# Patient Record
Sex: Female | Born: 1935 | Race: White | Hispanic: No | Marital: Married | Smoking: Former smoker
Health system: Southern US, Community
[De-identification: ages and names within clinical notes are randomized; demographics above are authoritative.]

## PROBLEM LIST (undated history)

## (undated) DIAGNOSIS — E7800 Pure hypercholesterolemia, unspecified: Secondary | ICD-10-CM

## (undated) DIAGNOSIS — D649 Anemia, unspecified: Secondary | ICD-10-CM

## (undated) DIAGNOSIS — F0390 Unspecified dementia without behavioral disturbance: Secondary | ICD-10-CM

## (undated) DIAGNOSIS — I10 Essential (primary) hypertension: Secondary | ICD-10-CM

## (undated) DIAGNOSIS — E079 Disorder of thyroid, unspecified: Secondary | ICD-10-CM

## (undated) DIAGNOSIS — K219 Gastro-esophageal reflux disease without esophagitis: Secondary | ICD-10-CM

## (undated) DIAGNOSIS — W19XXXA Unspecified fall, initial encounter: Secondary | ICD-10-CM

## (undated) DIAGNOSIS — E876 Hypokalemia: Secondary | ICD-10-CM

## (undated) DIAGNOSIS — R609 Edema, unspecified: Secondary | ICD-10-CM

## (undated) HISTORY — PX: SHOULDER SURGERY: SHX246

## (undated) HISTORY — PX: ABDOMINAL HYSTERECTOMY: SHX81

---

## 2003-12-14 ENCOUNTER — Ambulatory Visit: Payer: Self-pay | Admitting: Family Medicine

## 2004-06-19 ENCOUNTER — Ambulatory Visit: Payer: Self-pay | Admitting: Unknown Physician Specialty

## 2004-12-14 ENCOUNTER — Ambulatory Visit: Payer: Self-pay | Admitting: Family Medicine

## 2005-11-22 ENCOUNTER — Ambulatory Visit: Payer: Self-pay | Admitting: Family Medicine

## 2006-01-28 ENCOUNTER — Ambulatory Visit: Payer: Self-pay | Admitting: Family Medicine

## 2006-02-27 ENCOUNTER — Ambulatory Visit: Payer: Self-pay | Admitting: Family Medicine

## 2007-03-19 ENCOUNTER — Ambulatory Visit: Payer: Self-pay | Admitting: Family Medicine

## 2007-06-01 ENCOUNTER — Other Ambulatory Visit: Payer: Self-pay

## 2007-06-02 ENCOUNTER — Inpatient Hospital Stay: Payer: Self-pay | Admitting: Internal Medicine

## 2007-06-04 ENCOUNTER — Other Ambulatory Visit: Payer: Self-pay

## 2007-08-14 ENCOUNTER — Ambulatory Visit: Payer: Self-pay | Admitting: Family Medicine

## 2008-01-27 ENCOUNTER — Ambulatory Visit: Payer: Self-pay | Admitting: Cardiovascular Disease

## 2008-05-17 ENCOUNTER — Ambulatory Visit: Payer: Self-pay | Admitting: Family Medicine

## 2009-01-10 ENCOUNTER — Ambulatory Visit: Payer: Self-pay | Admitting: Family Medicine

## 2009-01-18 ENCOUNTER — Ambulatory Visit: Payer: Self-pay | Admitting: Family Medicine

## 2009-01-27 ENCOUNTER — Ambulatory Visit: Payer: Self-pay | Admitting: Family Medicine

## 2009-02-09 ENCOUNTER — Ambulatory Visit: Payer: Self-pay | Admitting: Family Medicine

## 2009-02-28 ENCOUNTER — Ambulatory Visit: Payer: Self-pay | Admitting: Family Medicine

## 2009-03-08 ENCOUNTER — Ambulatory Visit: Payer: Self-pay | Admitting: Unknown Physician Specialty

## 2009-05-09 ENCOUNTER — Ambulatory Visit: Payer: Self-pay | Admitting: Unknown Physician Specialty

## 2009-05-31 ENCOUNTER — Ambulatory Visit: Payer: Self-pay | Admitting: Family Medicine

## 2009-07-25 ENCOUNTER — Ambulatory Visit: Payer: Self-pay | Admitting: Family Medicine

## 2009-07-27 ENCOUNTER — Ambulatory Visit: Payer: Self-pay | Admitting: Family Medicine

## 2010-03-07 ENCOUNTER — Ambulatory Visit: Payer: Self-pay | Admitting: Family Medicine

## 2010-07-07 ENCOUNTER — Emergency Department: Payer: Self-pay | Admitting: Internal Medicine

## 2011-03-13 ENCOUNTER — Ambulatory Visit: Payer: Self-pay | Admitting: Family Medicine

## 2011-03-21 ENCOUNTER — Ambulatory Visit: Payer: Self-pay | Admitting: Unknown Physician Specialty

## 2011-10-30 ENCOUNTER — Ambulatory Visit: Payer: Self-pay | Admitting: Internal Medicine

## 2012-03-19 ENCOUNTER — Ambulatory Visit: Payer: Self-pay | Admitting: Internal Medicine

## 2012-09-09 ENCOUNTER — Ambulatory Visit: Payer: Self-pay

## 2013-01-11 ENCOUNTER — Emergency Department: Payer: Self-pay | Admitting: Emergency Medicine

## 2013-01-11 LAB — URINALYSIS, COMPLETE
Bilirubin,UR: NEGATIVE
Glucose,UR: >=500 mg/dL (ref 0–75)
Leukocyte Esterase: NEGATIVE
Ph: 5 (ref 4.5–8.0)
Protein: NEGATIVE
Squamous Epithelial: <1
WBC UR: 2 /HPF (ref 0–5)

## 2013-01-11 LAB — COMPREHENSIVE METABOLIC PANEL
Anion Gap: 8 (ref 7–16)
Bilirubin,Total: 0.2 mg/dL (ref 0.2–1.0)
Co2: 22 mmol/L (ref 21–32)
Creatinine: 1.19 mg/dL (ref 0.60–1.30)
EGFR (African American): 50 — ABNORMAL LOW
EGFR (Non-African Amer.): 43 — ABNORMAL LOW
Glucose: 226 mg/dL — ABNORMAL HIGH (ref 65–99)
Potassium: 3.3 mmol/L — ABNORMAL LOW (ref 3.5–5.1)
SGOT(AST): 26 U/L (ref 15–37)
SGPT (ALT): 22 U/L (ref 12–78)
Sodium: 141 mmol/L (ref 136–145)
Total Protein: 6.9 g/dL (ref 6.4–8.2)

## 2013-01-11 LAB — CBC
HCT: 33.9 % — ABNORMAL LOW (ref 35.0–47.0)
MCH: 29.2 pg (ref 26.0–34.0)
MCV: 87 fL (ref 80–100)
Platelet: 242 10*3/uL (ref 150–440)
RDW: 12.9 % (ref 11.5–14.5)
WBC: 12.9 10*3/uL — ABNORMAL HIGH (ref 3.6–11.0)

## 2013-01-18 ENCOUNTER — Emergency Department: Payer: Self-pay | Admitting: Emergency Medicine

## 2013-01-19 ENCOUNTER — Emergency Department: Payer: Self-pay | Admitting: Emergency Medicine

## 2013-02-26 ENCOUNTER — Ambulatory Visit: Payer: Self-pay | Admitting: Unknown Physician Specialty

## 2013-04-02 ENCOUNTER — Ambulatory Visit: Payer: Self-pay | Admitting: Internal Medicine

## 2013-10-23 ENCOUNTER — Emergency Department: Payer: Self-pay | Admitting: Emergency Medicine

## 2013-10-27 ENCOUNTER — Ambulatory Visit: Payer: Self-pay | Admitting: Unknown Physician Specialty

## 2014-04-11 ENCOUNTER — Emergency Department: Payer: Self-pay | Admitting: Emergency Medicine

## 2014-05-04 ENCOUNTER — Emergency Department: Payer: Self-pay | Admitting: Emergency Medicine

## 2014-05-04 LAB — URINALYSIS, COMPLETE
BILIRUBIN, UR: NEGATIVE
Glucose,UR: NEGATIVE mg/dL (ref 0–75)
KETONE: NEGATIVE
Nitrite: NEGATIVE
PH: 5 (ref 4.5–8.0)
Protein: 30
Specific Gravity: 1.03 (ref 1.003–1.030)

## 2014-05-04 LAB — CBC WITH DIFFERENTIAL/PLATELET
BASOS PCT: 0.6 %
Basophil #: 0 10*3/uL (ref 0.0–0.1)
Eosinophil #: 0.3 10*3/uL (ref 0.0–0.7)
Eosinophil %: 3.2 %
HCT: 36.2 % (ref 35.0–47.0)
HGB: 11.4 g/dL — ABNORMAL LOW (ref 12.0–16.0)
LYMPHS ABS: 1.3 10*3/uL (ref 1.0–3.6)
Lymphocyte %: 16.1 %
MCH: 24.2 pg — ABNORMAL LOW (ref 26.0–34.0)
MCHC: 31.5 g/dL — AB (ref 32.0–36.0)
MCV: 77 fL — ABNORMAL LOW (ref 80–100)
MONO ABS: 0.7 x10 3/mm (ref 0.2–0.9)
Monocyte %: 8.5 %
NEUTROS ABS: 5.8 10*3/uL (ref 1.4–6.5)
Neutrophil %: 71.6 %
Platelet: 273 10*3/uL (ref 150–440)
RBC: 4.71 10*6/uL (ref 3.80–5.20)
RDW: 15.8 % — ABNORMAL HIGH (ref 11.5–14.5)
WBC: 8.1 10*3/uL (ref 3.6–11.0)

## 2014-05-09 LAB — BASIC METABOLIC PANEL
Anion Gap: 8 (ref 7–16)
BUN: 11 mg/dL
Calcium, Total: 8.9 mg/dL
Chloride: 105 mmol/L
Co2: 25 mmol/L
Creatinine: 1.1 mg/dL — ABNORMAL HIGH
GFR CALC AF AMER: 55 — AB
GFR CALC NON AF AMER: 47 — AB
Glucose: 159 mg/dL — ABNORMAL HIGH
Potassium: 2.9 mmol/L — ABNORMAL LOW
Sodium: 138 mmol/L

## 2014-05-09 LAB — TROPONIN I: Troponin-I: <0.03 ng/mL

## 2014-10-15 ENCOUNTER — Emergency Department
Admission: EM | Admit: 2014-10-15 | Discharge: 2014-10-16 | Disposition: A | Discharge status: Home / Self Care | Payer: Medicare Other | Attending: Emergency Medicine | Admitting: Emergency Medicine

## 2014-10-15 ENCOUNTER — Encounter: Payer: Self-pay | Admitting: Emergency Medicine

## 2014-10-15 DIAGNOSIS — Y998 Other external cause status: Secondary | ICD-10-CM | POA: Diagnosis not present

## 2014-10-15 DIAGNOSIS — Z79899 Other long term (current) drug therapy: Secondary | ICD-10-CM | POA: Insufficient documentation

## 2014-10-15 DIAGNOSIS — Y9389 Activity, other specified: Secondary | ICD-10-CM | POA: Diagnosis not present

## 2014-10-15 DIAGNOSIS — S40012A Contusion of left shoulder, initial encounter: Secondary | ICD-10-CM | POA: Insufficient documentation

## 2014-10-15 DIAGNOSIS — S52501A Unspecified fracture of the lower end of right radius, initial encounter for closed fracture: Secondary | ICD-10-CM | POA: Insufficient documentation

## 2014-10-15 DIAGNOSIS — S7002XA Contusion of left hip, initial encounter: Secondary | ICD-10-CM | POA: Diagnosis not present

## 2014-10-15 DIAGNOSIS — Z791 Long term (current) use of non-steroidal anti-inflammatories (NSAID): Secondary | ICD-10-CM | POA: Insufficient documentation

## 2014-10-15 DIAGNOSIS — N390 Urinary tract infection, site not specified: Secondary | ICD-10-CM | POA: Insufficient documentation

## 2014-10-15 DIAGNOSIS — W19XXXA Unspecified fall, initial encounter: Secondary | ICD-10-CM

## 2014-10-15 DIAGNOSIS — Y9289 Other specified places as the place of occurrence of the external cause: Secondary | ICD-10-CM | POA: Diagnosis not present

## 2014-10-15 DIAGNOSIS — W1839XA Other fall on same level, initial encounter: Secondary | ICD-10-CM | POA: Diagnosis not present

## 2014-10-15 DIAGNOSIS — I10 Essential (primary) hypertension: Secondary | ICD-10-CM | POA: Diagnosis not present

## 2014-10-15 DIAGNOSIS — S6991XA Unspecified injury of right wrist, hand and finger(s), initial encounter: Secondary | ICD-10-CM | POA: Diagnosis present

## 2014-10-15 HISTORY — DX: Disorder of thyroid, unspecified: E07.9

## 2014-10-15 HISTORY — DX: Anemia, unspecified: D64.9

## 2014-10-15 HISTORY — DX: Hypokalemia: E87.6

## 2014-10-15 HISTORY — DX: Essential (primary) hypertension: I10

## 2014-10-15 HISTORY — DX: Pure hypercholesterolemia, unspecified: E78.00

## 2014-10-15 HISTORY — DX: Unspecified dementia, unspecified severity, without behavioral disturbance, psychotic disturbance, mood disturbance, and anxiety: F03.90

## 2014-10-15 LAB — BASIC METABOLIC PANEL
ANION GAP: 7 (ref 5–15)
BUN: 14 mg/dL (ref 6–20)
CHLORIDE: 111 mmol/L (ref 101–111)
CO2: 22 mmol/L (ref 22–32)
Calcium: 9.3 mg/dL (ref 8.9–10.3)
Creatinine, Ser: 0.8 mg/dL (ref 0.44–1.00)
GFR calc Af Amer: >60 mL/min (ref >60)
Glucose, Bld: 148 mg/dL — ABNORMAL HIGH (ref 65–99)
POTASSIUM: 3.5 mmol/L (ref 3.5–5.1)
SODIUM: 140 mmol/L (ref 135–145)

## 2014-10-15 LAB — CBC
HEMATOCRIT: 39.8 % (ref 35.0–47.0)
HEMOGLOBIN: 13.4 g/dL (ref 12.0–16.0)
MCH: 28.9 pg (ref 26.0–34.0)
MCHC: 33.6 g/dL (ref 32.0–36.0)
MCV: 85.8 fL (ref 80.0–100.0)
Platelets: 241 10*3/uL (ref 150–440)
RBC: 4.63 MIL/uL (ref 3.80–5.20)
RDW: 14.6 % — ABNORMAL HIGH (ref 11.5–14.5)
WBC: 10.3 10*3/uL (ref 3.6–11.0)

## 2014-10-15 NOTE — ED Notes (Addendum)
Pt's husband reports pt fell tonight on right side, denies hitting head or LOC. Pt reports right wrist pain. Pt unsure if slipped and fell.  Son reports pt fell on right side 2 weeks ago, reports hairline fx to right wrist. Pt actually fell on left side, c/o left hip pain. EMS said leg movement was good. Pt was confused at time of falling, thought there was another woman in the house when it was her husband.

## 2014-10-16 ENCOUNTER — Emergency Department: Payer: Medicare Other

## 2014-10-16 LAB — URINALYSIS COMPLETE WITH MICROSCOPIC (ARMC ONLY)
Bilirubin Urine: NEGATIVE
GLUCOSE, UA: NEGATIVE mg/dL
Ketones, ur: NEGATIVE mg/dL
NITRITE: NEGATIVE
Protein, ur: NEGATIVE mg/dL
SPECIFIC GRAVITY, URINE: 1.013 (ref 1.005–1.030)
pH: 5 (ref 5.0–8.0)

## 2014-10-16 MED ORDER — NITROFURANTOIN MONOHYD MACRO 100 MG PO CAPS
100.0000 mg | ORAL_CAPSULE | Freq: Once | ORAL | Status: AC
  Administered 2014-10-16: 100 mg via ORAL
  Filled 2014-10-16: qty 1

## 2014-10-16 MED ORDER — HYDROCODONE-ACETAMINOPHEN 5-325 MG PO TABS
1.0000 | ORAL_TABLET | Freq: Once | ORAL | Status: AC
  Administered 2014-10-16: 1 via ORAL
  Filled 2014-10-16: qty 1

## 2014-10-16 MED ORDER — HYDROCODONE-ACETAMINOPHEN 5-325 MG PO TABS: 1.0000 | ORAL_TABLET | Freq: Four times a day (QID) | ORAL | Status: DC | PRN

## 2014-10-16 MED ORDER — MORPHINE SULFATE (PF) 2 MG/ML IV SOLN
2.0000 mg | Freq: Once | INTRAVENOUS | Status: AC
  Administered 2014-10-16: 2 mg via INTRAVENOUS
  Filled 2014-10-16: qty 1

## 2014-10-16 MED ORDER — SODIUM CHLORIDE 0.9 % IV BOLUS (SEPSIS)
500.0000 mL | Freq: Once | INTRAVENOUS | Status: AC
  Administered 2014-10-16: 500 mL via INTRAVENOUS

## 2014-10-16 MED ORDER — NITROFURANTOIN MONOHYD MACRO 100 MG PO CAPS: 100.0000 mg | ORAL_CAPSULE | Freq: Two times a day (BID) | ORAL | Status: DC

## 2014-10-16 MED ORDER — ONDANSETRON HCL 4 MG/2ML IJ SOLN
4.0000 mg | Freq: Once | INTRAMUSCULAR | Status: AC
  Administered 2014-10-16: 4 mg via INTRAVENOUS
  Filled 2014-10-16: qty 2

## 2014-10-16 MED ORDER — IBUPROFEN 600 MG PO TABS: 600.0000 mg | ORAL_TABLET | Freq: Three times a day (TID) | ORAL | Status: DC | PRN

## 2014-10-16 NOTE — ED Provider Notes (Signed)
[facility] Emergency Department Provider Note  ____________________________________________  Time seen: Approximately 12:04 AM  I have reviewed the triage vital signs and the nursing notes.   HISTORY  Chief Complaint Fall and Wrist Pain  History obtain by patient, spouse, son and daughter-in-law.  HPI Alicia [name] is a 81 y.o. female who presents to the ED from home s/p fall. Patient is unsure what made her fall; husband states she was walking across the floor and fell onto her left side. Patient did not strike her head nor lose consciousness. Complains of left shoulder and left hip pain. Patient fell approximately 2 weeks ago and has a known hairline fracture to her right wrist. Right wrist appears more swollen since her fall this evening. EMS came out to the house; reports patient was somewhat confused. Patient declined EMS transport and came by POV. Denies recent fever, chills, chest pain, shortness of breath, abdominal pain, dysuria, vomiting, diarrhea, headache, neck pain.   Past Medical History  Diagnosis Date  . High cholesterol   . Low blood potassium   . Thyroid disease     hypothyroidism  . Anemia   . Hypertension   . Dementia     There are no active problems to display for this patient.   Past Surgical History  Procedure Laterality Date  . Abdominal hysterectomy    . Shoulder surgery Right     Current Outpatient Rx  Name  Route  Sig  Dispense  Refill  . donepezil (ARICEPT) 10 MG tablet   Oral   Take 1 tablet by mouth at bedtime.         . ferrous sulfate 325 (65 FE) MG EC tablet   Oral   Take 1 tablet by mouth daily.         Marland Kitchen levothyroxine (SYNTHROID, LEVOTHROID) 75 MCG tablet   Oral   Take 1 tablet by mouth every morning.         . meloxicam (MOBIC) 7.5 MG tablet   Oral   Take 1 tablet by mouth daily.         . memantine (NAMENDA) 10 MG tablet   Oral   Take 1 tablet by mouth daily.         . pantoprazole  (PROTONIX) 40 MG tablet   Oral   Take 1 tablet by mouth daily.         . potassium chloride SA (K-DUR,KLOR-CON) 20 MEQ tablet   Oral   Take 1 tablet by mouth daily.         . sertraline (ZOLOFT) 50 MG tablet   Oral   Take 1 tablet by mouth daily.         . simvastatin (ZOCOR) 20 MG tablet   Oral   Take 1 tablet by mouth at bedtime.         Marland Kitchen HYDROcodone-acetaminophen (NORCO) 5-325 MG per tablet   Oral   Take 1 tablet by mouth every 6 (six) hours as needed for moderate pain.   15 tablet   0   . ibuprofen (ADVIL,MOTRIN) 600 MG tablet   Oral   Take 1 tablet (600 mg total) by mouth every 8 (eight) hours as needed.   15 tablet   0   . nitrofurantoin, macrocrystal-monohydrate, (MACROBID) 100 MG capsule   Oral   Take 1 capsule (100 mg total) by mouth 2 (two) times daily.   10 capsule   0     Allergies Review of patient's  allergies indicates no known allergies.  No family history on file.  Social History Social History  Substance Use Topics  . Smoking status: Never Smoker   . Smokeless tobacco: None  . Alcohol Use: No    Review of Systems Constitutional: No fever/chills Eyes: No visual changes. ENT: No sore throat. Cardiovascular: Denies chest pain. Respiratory: Denies shortness of breath. Gastrointestinal: No abdominal pain.  No nausea, no vomiting.  No diarrhea.  No constipation. Genitourinary: Negative for dysuria. Musculoskeletal: Positive for right wrist, left shoulder and left hip pain. Negative for back pain. Skin: Negative for rash. Neurological: Negative for headaches, focal weakness or numbness.  10-point ROS otherwise negative.  ____________________________________________   PHYSICAL EXAM:  VITAL SIGNS: ED Triage Vitals  Enc Vitals Group     BP 10/15/14 2228 163/71 mmHg     Pulse Rate 10/15/14 2228 62     Resp 10/15/14 2335 14     Temp 10/15/14 2228 98.4 F (36.9 C)     Temp Source 10/15/14 2228 Oral     SpO2 10/15/14 2228 97 %      Weight 10/15/14 2228 140 lb (63.504 kg)     Height 10/15/14 2228 5' (1.524 m)     Head Cir --      Peak Flow --      Pain Score 10/15/14 2235 5     Pain Loc --      Pain Edu? --      Excl. in GC? --     Constitutional: Alert and oriented. Well appearing and in mild acute distress. Eyes: Conjunctivae are normal. PERRL. EOMI. Head: Atraumatic. Nose: No congestion/rhinnorhea. Mouth/Throat: Mucous membranes are moist.  Oropharynx non-erythematous. Neck: No stridor. No cervical spine tenderness to palpation. No step-offs or deformity. Cardiovascular: Normal rate, regular rhythm. Grossly normal heart sounds.  Good peripheral circulation. Respiratory: Normal respiratory effort.  No retractions. Lungs CTAB. Gastrointestinal: Soft and nontender. No distention. No abdominal bruits. No CVA tenderness. Genitourinary: Strong smell of urine. Musculoskeletal: Right wrist with swelling to dorsal aspect. Left shoulder with contusion but full range of movement. Pelvis stable. Left hip tender to palpation. Leg is not shortened nor rotated.  Neurologic: Alert and oriented to person and place. Normal speech and language. No gross focal neurologic deficits are appreciated.  Skin:  Skin is warm, dry and intact. No rash noted. Psychiatric: Mood and affect are normal. Speech and behavior are normal.  ____________________________________________   LABS (all labs ordered are listed, but only abnormal results are displayed)  Labs Reviewed  BASIC METABOLIC PANEL - Abnormal; Notable for the following:    Glucose, Bld 148 (*)    All other components within normal limits  CBC - Abnormal; Notable for the following:    RDW 14.6 (*)    All other components within normal limits  URINALYSIS COMPLETEWITH MICROSCOPIC ([facility] ONLY) - Abnormal; Notable for the following:    Color, Urine YELLOW (*)    APPearance HAZY (*)    Hgb urine dipstick 2+ (*)    Leukocytes, UA TRACE (*)    Bacteria, UA RARE (*)     Squamous Epithelial / LPF 0-5 (*)    All other components within normal limits  TROPONIN I   ____________________________________________  EKG  ED ECG REPORT I, Saysha Menta J, the attending physician, personally viewed and interpreted this ECG.   Date: 10/16/2014  EKG Time: 2243  Rate: 62  Rhythm: normal EKG, normal sinus rhythm  Axis: Normal  Intervals:none  ST&T Change: Nonspecific  ____________________________________________  RADIOLOGY  Right wrist x-ray (viewed by me, interpreted per Dr. Clovis Riley): Distal radius fracture with mild impaction.  Left shoulder x-rays (viewed by me, interpreted per Dr. Cherly Hensen): No evidence of fracture or dislocation.  Left hip x-ray (viewed by me, interpreted per Dr. Clovis Riley): Severe left hip arthritis.  CT head without contrast interpreted per Dr. Clovis Riley: Negative for acute intracranial traumatic injury. Unchanged small right frontal meningiomas. There is moderate generalized atrophy and chronic microvascular ischemic disease. ____________________________________________   PROCEDURES  Procedure(s) performed:   SPLINT APPLICATION Date/Time: 2:57 AM Authorized by: Irean Hong Consent: Verbal consent obtained. Risks and benefits: risks, benefits and alternatives were discussed Consent given by: patient Splint applied by: orthopedic technician Location details:  right wrist Splint type: thumb spica Supplies used: OCL Post-procedure: The splinted body part was neurovascularly unchanged following the procedure. Patient tolerance: Patient tolerated the procedure well with no immediate complications.    Critical Care performed: No  ____________________________________________   INITIAL IMPRESSION / ASSESSMENT AND PLAN / ED COURSE  Pertinent labs & imaging results that were available during my care of the patient were reviewed by me and considered in my medical decision making (see chart for details).  81 year old female  s/p fall with extremity pain and injury. Will obtain screening lab work and urinalysis; obtain CT head given patient's confusion per EMS report although patient is not confused here in the ED , plain film x-rays of right wrist, left shoulder and left hip. Will administer IV analgesia.  ----------------------------------------- 2:00 AM on 10/16/2014 -----------------------------------------  Updated patient and family of imaging results. Awaiting urine specimen. Will splint right wrist.  ----------------------------------------- 2:57 AM on 10/16/2014 -----------------------------------------  Patient ambulated to commode with minimal assistance. Has a walker at home. Plan for antibiotics for mild UTI, analgesics and orthopedics follow-up. Strict return precautions given. Patient and family verbalize understanding and agree with plan of care.  ____________________________________________   FINAL CLINICAL IMPRESSION(S) / ED DIAGNOSES  Final diagnoses:  Fall, initial encounter  Radius distal fracture, right, closed, initial encounter  UTI (lower urinary tract infection)  Shoulder contusion, left, initial encounter  Contusion, hip, left, initial encounter      Irean Hong, MD 10/16/14 [phone]

## 2014-10-16 NOTE — Discharge Instructions (Signed)
1. Take pain medicines as needed (Motrin/Norco #15). 2. Keep splint clean and dry.  3. Apply ice to affected area several times daily. 4. Take antibiotic as prescribed (Macrobid 100 mg twice daily 5 days). 5. Return to the ER for worsening symptoms, persistent vomiting, lethargy or other concerns.  Fall Prevention and Home Safety Falls cause injuries and can affect all age groups. It is possible to use preventive measures to significantly decrease the likelihood of falls. There are many simple measures which can make your home safer and prevent falls. OUTDOORS  Repair cracks and edges of walkways and driveways.  Remove high doorway thresholds.  Trim shrubbery on the main path into your home.  Have good outside lighting.  Clear walkways of tools, rocks, debris, and clutter.  Check that handrails are not broken and are securely fastened. Both sides of steps should have handrails.  Have leaves, snow, and ice cleared regularly.  Use sand or salt on walkways during winter months.  In the garage, clean up grease or oil spills. BATHROOM  Install night lights.  Install grab bars by the toilet and in the tub and shower.  Use non-skid mats or decals in the tub or shower.  Place a plastic non-slip stool in the shower to sit on, if needed.  Keep floors dry and clean up all water on the floor immediately.  Remove soap buildup in the tub or shower on a regular basis.  Secure bath mats with non-slip, double-sided rug tape.  Remove throw rugs and tripping hazards from the floors. BEDROOMS  Install night lights.  Make sure a bedside light is easy to reach.  Do not use oversized bedding.  Keep a telephone by your bedside.  Have a firm chair with side arms to use for getting dressed.  Remove throw rugs and tripping hazards from the floor. KITCHEN  Keep handles on pots and pans turned toward the center of the stove. Use back burners when possible.  Clean up spills quickly  and allow time for drying.  Avoid walking on wet floors.  Avoid hot utensils and knives.  Position shelves so they are not too high or low.  Place commonly used objects within easy reach.  If necessary, use a sturdy step stool with a grab bar when reaching.  Keep electrical cables out of the way.  Do not use floor polish or wax that makes floors slippery. If you must use wax, use non-skid floor wax.  Remove throw rugs and tripping hazards from the floor. STAIRWAYS  Never leave objects on stairs.  Place handrails on both sides of stairways and use them. Fix any loose handrails. Make sure handrails on both sides of the stairways are as long as the stairs.  Check carpeting to make sure it is firmly attached along stairs. Make repairs to worn or loose carpet promptly.  Avoid placing throw rugs at the top or bottom of stairways, or properly secure the rug with carpet tape to prevent slippage. Get rid of throw rugs, if possible.  Have an electrician put in a light switch at the top and bottom of the stairs. OTHER FALL PREVENTION TIPS  Wear low-heel or rubber-soled shoes that are supportive and fit well. Wear closed toe shoes.  When using a stepladder, make sure it is fully opened and both spreaders are firmly locked. Do not climb a closed stepladder.  Add color or contrast paint or tape to grab bars and handrails in your home. Place contrasting color strips on  first and last steps.  Learn and use mobility aids as needed. Install an electrical emergency response system.  Turn on lights to avoid dark areas. Replace light bulbs that burn out immediately. Get light switches that glow.  Arrange furniture to create clear pathways. Keep furniture in the same place.  Firmly attach carpet with non-skid or double-sided tape.  Eliminate uneven floor surfaces.  Select a carpet pattern that does not visually hide the edge of steps.  Be aware of all pets. OTHER HOME SAFETY TIPS  Set  the water temperature for 120 F (48.8 C).  Keep emergency numbers on or near the telephone.  Keep smoke detectors on every level of the home and near sleeping areas. Document Released: 01/18/2002 Document Revised: 07/30/2011 Document Reviewed: 04/19/2011 [facility] Patient Information 2015 [place], [place]. This information is not intended to replace advice given to you by your health care provider. Make sure you discuss any questions you have with your health care provider.  Urinary Tract Infection A urinary tract infection (UTI) can occur any place along the urinary tract. The tract includes the kidneys, ureters, bladder, and urethra. A type of germ called bacteria often causes a UTI. UTIs are often helped with antibiotic medicine.  HOME CARE   If given, take antibiotics as told by your doctor. Finish them even if you start to feel better.  Drink enough fluids to keep your pee (urine) clear or pale yellow.  Avoid tea, drinks with caffeine, and bubbly (carbonated) drinks.  Pee often. Avoid holding your pee in for a long time.  Pee before and after having sex (intercourse).  Wipe from front to back after you poop (bowel movement) if you are a woman. Use each tissue only once. GET HELP RIGHT AWAY IF:   You have back pain.  You have lower belly (abdominal) pain.  You have chills.  You feel sick to your stomach (nauseous).  You throw up (vomit).  Your burning or discomfort with peeing does not go away.  You have a fever.  Your symptoms are not better in 3 days. MAKE SURE YOU:   Understand these instructions.  Will watch your condition.  Will get help right away if you are not doing well or get worse. Document Released: 07/17/2007 Document Revised: 10/23/2011 Document Reviewed: 08/29/2011 [facility] Patient Information 2015 [place], [place]. This information is not intended to replace advice given to you by your health care provider. Make sure you discuss any questions you  have with your health care provider.  Splint Care Splints protect and rest injuries. Splints can be made of plaster, fiberglass, or metal. They are used to treat broken bones, sprains, tendonitis, and other injuries. HOME CARE  Keep the injured area raised (elevated) while sitting or lying down. Keep the injured body part just above the level of the heart. This will decrease puffiness (swelling) and pain.  If an elastic bandage was used to hold the splint, it can be loosened. Only loosen it to make room for puffiness and to ease pain.  Keep the splint clean and dry.  Do not scratch the skin under the splint with sharp or pointed objects.  Follow up with your doctor as told. GET HELP RIGHT AWAY IF:   There is more pain or pressure around the injury.  There is numbness, tingling, or pain in the toes or fingers past the injury.  The fingers or toes become cold or blue.  The splint becomes too soft or breaks before the injury is  healed. MAKE SURE YOU:   Understand these instructions.  Will watch this condition.  Will get help right away if you are not doing well or get worse. Document Released: 11/07/2007 Document Revised: 04/22/2011 Document Reviewed: 11/07/2007 [facility] Patient Information 2015 [place], [place]. This information is not intended to replace advice given to you by your health care provider. Make sure you discuss any questions you have with your health care provider.  Radial Fracture You have a broken bone (fracture) of the forearm. This is the part of your arm between the elbow and your wrist. Your forearm is made up of two bones. These are the radius and ulna. Your fracture is in the radial shaft. This is the bone in your forearm located on the thumb side. A cast or splint is used to protect and keep your injured bone from moving. The cast or splint will be on generally for about 5 to 6 weeks, with individual variations. HOME CARE INSTRUCTIONS   Keep the injured part  elevated while sitting or lying down. Keep the injury above the level of your heart (the center of the chest). This will decrease swelling and pain.  Apply ice to the injury for 15-20 minutes, 03-04 times per day while awake, for 2 days. Put the ice in a plastic bag and place a towel between the bag of ice and your cast or splint.  Move your fingers to avoid stiffness and minimize swelling.  If you have a plaster or fiberglass cast:  Do not try to scratch the skin under the cast using sharp or pointed objects.  Check the skin around the cast every day. You may put lotion on any red or sore areas.  Keep your cast dry and clean.  If you have a plaster splint:  Wear the splint as directed.  You may loosen the elastic around the splint if your fingers become numb, tingle, or turn cold or blue.  Do not put pressure on any part of your cast or splint. It may break. Rest your cast only on a pillow for the first 24 hours until it is fully hardened.  Your cast or splint can be protected during bathing with a plastic bag. Do not lower the cast or splint into water.  Only take over-the-counter or prescription medicines for pain, discomfort, or fever as directed by your caregiver. SEEK IMMEDIATE MEDICAL CARE IF:   Your cast gets damaged or breaks.  You have more severe pain or swelling than you did before getting the cast.  You have severe pain when stretching your fingers.  There is a bad smell, new stains and/or pus-like (purulent) drainage coming from under the cast.  Your fingers or hand turn pale or blue and become cold or your loose feeling. Document Released: 07/11/2005 Document Revised: 04/22/2011 Document Reviewed: 10/07/2005 [facility] Patient Information 2015 [place], [place]. This information is not intended to replace advice given to you by your health care provider. Make sure you discuss any questions you have with your health care provider.  Contusion A contusion is a deep  bruise. Contusions are the result of an injury that caused bleeding under the skin. The contusion may turn blue, purple, or yellow. Minor injuries will give you a painless contusion, but more severe contusions may stay painful and swollen for a few weeks.  CAUSES  A contusion is usually caused by a blow, trauma, or direct force to an area of the body. SYMPTOMS   Swelling and redness of the injured  area.  Bruising of the injured area.  Tenderness and soreness of the injured area.  Pain. DIAGNOSIS  The diagnosis can be made by taking a history and physical exam. An X-ray, CT scan, or MRI may be needed to determine if there were any associated injuries, such as fractures. TREATMENT  Specific treatment will depend on what area of the body was injured. In general, the best treatment for a contusion is resting, icing, elevating, and applying cold compresses to the injured area. Over-the-counter medicines may also be recommended for pain control. Ask your caregiver what the best treatment is for your contusion. HOME CARE INSTRUCTIONS   Put ice on the injured area.  Put ice in a plastic bag.  Place a towel between your skin and the bag.  Leave the ice on for 15-20 minutes, 3-4 times a day, or as directed by your health care provider.  Only take over-the-counter or prescription medicines for pain, discomfort, or fever as directed by your caregiver. Your caregiver may recommend avoiding anti-inflammatory medicines (aspirin, ibuprofen, and naproxen) for 48 hours because these medicines may increase bruising.  Rest the injured area.  If possible, elevate the injured area to reduce swelling. SEEK IMMEDIATE MEDICAL CARE IF:   You have increased bruising or swelling.  You have pain that is getting worse.  Your swelling or pain is not relieved with medicines. MAKE SURE YOU:   Understand these instructions.  Will watch your condition.  Will get help right away if you are not doing well or  get worse. Document Released: 11/07/2004 Document Revised: 02/02/2013 Document Reviewed: 12/03/2010 [facility] Patient Information 2015 [place], [place]. This information is not intended to replace advice given to you by your health care provider. Make sure you discuss any questions you have with your health care provider.  Iliac Crest Contusion An iliac crest contusion is a deep bruise of your hip bone (hip pointer). Contusions are the result of an injury that caused bleeding under the skin. The contusion may turn blue, purple, or yellow. Minor injuries will give you a painless contusion, but more severe contusions may stay painful and swollen for a few weeks.  CAUSES  An iliac crest contusion is usually caused by a blow to the top of your hip pointer. The injury most often comes from contact sports.  SYMPTOMS   Swelling and redness of the hip area.  Bruising of the hip area.  Tenderness or soreness of the hip. DIAGNOSIS  The diagnosis can be made by taking your history and doing a physical exam. You may need an X-ray of your hip pointer to look for a broken bone (fracture). TREATMENT  Often, the best treatment for an iliac crest contusion is resting, icing, elevating, and applying cold compresses to the injured area. Over-the-counter medicines may also be recommended for pain control. Crutches may be used if walking is very painful. Some people need physical therapy to help with range of motion and strengthening.  HOME CARE INSTRUCTIONS   Put ice on the injured area.  Put ice in a plastic bag.  Place a towel between your skin and the bag.  Leave the ice on for 15-20 minutes, 03-04 times a day.  Only take over-the-counter or prescription medicines for pain, discomfort, or fever as directed by your caregiver. Your caregiver may recommend avoiding anti-inflammatory medicines (aspirin, ibuprofen, and naproxen) for 48 hours because these medicines may increase bruising.  Keep your leg  straightened (extended) when possible.  Walk or move around as  the pain allows, or as directed by your caregiver. Use crutches if your caregiver recommends them.  Apply compression wraps as directed by your caregiver. You may remove the wraps for sleeping, showers, and baths. SEEK IMMEDIATE MEDICAL CARE IF:   You have increased bruising or swelling.  You have pain that is getting worse.  Your swelling or pain is not relieved with medicines.  Your toes get cold. MAKE SURE YOU:   Understand these instructions.  Will watch your condition.  Will get help right away if you are not doing well or get worse. Document Released: 10/23/2000 Document Revised: 04/22/2011 Document Reviewed: 12/14/2010 [facility] Patient Information 2015 [place], [place]. This information is not intended to replace advice given to you by your health care provider. Make sure you discuss any questions you have with your health care provider.

## 2014-11-30 ENCOUNTER — Other Ambulatory Visit: Payer: Self-pay | Admitting: Internal Medicine

## 2014-11-30 DIAGNOSIS — Z1231 Encounter for screening mammogram for malignant neoplasm of breast: Secondary | ICD-10-CM

## 2014-12-15 ENCOUNTER — Ambulatory Visit
Admission: RE | Admit: 2014-12-15 | Discharge: 2014-12-15 | Disposition: A | Discharge status: Home / Self Care | Payer: Medicare Other | Source: Ambulatory Visit | Attending: Internal Medicine | Admitting: Internal Medicine

## 2014-12-15 ENCOUNTER — Other Ambulatory Visit: Payer: Self-pay | Admitting: Internal Medicine

## 2014-12-15 DIAGNOSIS — Z1231 Encounter for screening mammogram for malignant neoplasm of breast: Secondary | ICD-10-CM

## 2015-04-02 ENCOUNTER — Emergency Department
Admission: EM | Admit: 2015-04-02 | Discharge: 2015-04-02 | Disposition: A | Discharge status: Home / Self Care | Payer: Medicare Other | Attending: Emergency Medicine | Admitting: Emergency Medicine

## 2015-04-02 ENCOUNTER — Encounter: Payer: Self-pay | Admitting: Emergency Medicine

## 2015-04-02 DIAGNOSIS — Z791 Long term (current) use of non-steroidal anti-inflammatories (NSAID): Secondary | ICD-10-CM | POA: Insufficient documentation

## 2015-04-02 DIAGNOSIS — Z79899 Other long term (current) drug therapy: Secondary | ICD-10-CM | POA: Diagnosis not present

## 2015-04-02 DIAGNOSIS — B351 Tinea unguium: Secondary | ICD-10-CM | POA: Insufficient documentation

## 2015-04-02 DIAGNOSIS — I10 Essential (primary) hypertension: Secondary | ICD-10-CM | POA: Insufficient documentation

## 2015-04-02 DIAGNOSIS — Z792 Long term (current) use of antibiotics: Secondary | ICD-10-CM | POA: Diagnosis not present

## 2015-04-02 DIAGNOSIS — M79675 Pain in left toe(s): Secondary | ICD-10-CM | POA: Diagnosis present

## 2015-04-02 NOTE — Discharge Instructions (Signed)
Nail Ringworm A fungal infection of the nail (tinea unguium/onychomycosis) is common. It is common as the visible part of the nail is composed of dead cells which have no blood supply to help prevent infection. It occurs because fungi are everywhere and will pick any opportunity to grow on any dead material. Because nails are very slow growing they require up to 2 years of treatment with anti-fungal medications. The entire nail back to the base is infected. This includes approximately  of the nail which you cannot see. If your caregiver has prescribed a medication by mouth, take it every day and as directed. No progress will be seen for at least 6 to 9 months. Do not be disappointed! Because fungi live on dead cells with little or no exposure to blood supply, medication delivery to the infection is slow; thus the cure is slow. It is also why you can observe no progress in the first 6 months. The nail becoming cured is the base of the nail, as it has the blood supply. Topical medication such as creams and ointments are usually not effective. Important in successful treatment of nail fungus is closely following the medication regimen that your doctor prescribes. Sometimes you and your caregiver may elect to speed up this process by surgical removal of all the nails. Even this may still require 6 to 9 months of additional oral medications. See your caregiver as directed. Remember there will be no visible improvement for at least 6 months. See your caregiver sooner if other signs of infection (redness and swelling) develop.   This information is not intended to replace advice given to you by your health care provider. Make sure you discuss any questions you have with your health care provider.   Document Released: 01/26/2000 Document Revised: 06/14/2014 Document Reviewed: 08/01/2014 Elsevier Interactive Patient Education 2016 [employer].   You have one area on her toenail that is worrisome and should be  looked at by a podiatrist. The remainder is a fungal infection were toenail gets thicker. Soak her foot in Epsom salt once or twice a day until he see the podiatrist.

## 2015-04-02 NOTE — ED Provider Notes (Signed)
[facility] Emergency Department Provider Note ____________________________________________  Time seen: Approximately 3:06 PM  I have reviewed the triage vital signs and the nursing notes.   HISTORY  Chief Complaint Toe Pain   HPI Alicia [name] is a 82 y.o. female is brought in today by her husband with complaints of left great toe pain for 2 days. When asked patient states that it is not hurting now. She had her husband both agree that she has not had an injury to her toe and there is been no swelling or redness noted. Patient states that the pain does not keep her up during the night and does not make it difficult for her to walk. Husband also agrees. Currently she denies pain.Patient's husband states that she has some dementia.   Past Medical History  Diagnosis Date  . High cholesterol   . Low blood potassium   . Thyroid disease     hypothyroidism  . Anemia   . Hypertension   . Dementia     There are no active problems to display for this patient.   Past Surgical History  Procedure Laterality Date  . Abdominal hysterectomy    . Shoulder surgery Right     Current Outpatient Rx  Name  Route  Sig  Dispense  Refill  . donepezil (ARICEPT) 10 MG tablet   Oral   Take 1 tablet by mouth at bedtime.         . ferrous sulfate 325 (65 FE) MG EC tablet   Oral   Take 1 tablet by mouth daily.         Marland Kitchen HYDROcodone-acetaminophen (NORCO) 5-325 MG per tablet   Oral   Take 1 tablet by mouth every 6 (six) hours as needed for moderate pain.   15 tablet   0   . ibuprofen (ADVIL,MOTRIN) 600 MG tablet   Oral   Take 1 tablet (600 mg total) by mouth every 8 (eight) hours as needed.   15 tablet   0   . levothyroxine (SYNTHROID, LEVOTHROID) 75 MCG tablet   Oral   Take 1 tablet by mouth every morning.         . meloxicam (MOBIC) 7.5 MG tablet   Oral   Take 1 tablet by mouth daily.         . memantine (NAMENDA) 10 MG tablet   Oral   Take 1  tablet by mouth daily.         . nitrofurantoin, macrocrystal-monohydrate, (MACROBID) 100 MG capsule   Oral   Take 1 capsule (100 mg total) by mouth 2 (two) times daily.   10 capsule   0   . pantoprazole (PROTONIX) 40 MG tablet   Oral   Take 1 tablet by mouth daily.         . potassium chloride SA (K-DUR,KLOR-CON) 20 MEQ tablet   Oral   Take 1 tablet by mouth daily.         . sertraline (ZOLOFT) 50 MG tablet   Oral   Take 1 tablet by mouth daily.         . simvastatin (ZOCOR) 20 MG tablet   Oral   Take 1 tablet by mouth at bedtime.           Allergies Review of patient's allergies indicates no known allergies.  Family History  Problem Relation Age of Onset  . Breast cancer Sister 62    Social History Social History  Substance Use Topics  .  Smoking status: Never Smoker   . Smokeless tobacco: None  . Alcohol Use: No    Review of Systems Constitutional: No fever/chills Cardiovascular: Denies chest pain. Respiratory: Denies shortness of breath. Gastrointestinal:  No nausea, no vomiting.  Musculoskeletal: Positive left great toe pain. Skin: Negative for rash. Neurological: Negative for headaches, focal weakness or numbness.  10-point ROS otherwise negative.  ____________________________________________   PHYSICAL EXAM:  VITAL SIGNS: ED Triage Vitals  Enc Vitals Group     BP 04/02/15 1316 130/73 mmHg     Pulse Rate 04/02/15 1316 63     Resp 04/02/15 1316 16     Temp 04/02/15 1316 98.2 F (36.8 C)     Temp Source 04/02/15 1316 Oral     SpO2 04/02/15 1316 98 %     Weight 04/02/15 1316 140 lb (63.504 kg)     Height 04/02/15 1316 5' (1.524 m)     Head Cir --      Peak Flow --      Pain Score --      Pain Loc --      Pain Edu? --      Excl. in GC? --     Constitutional: Alert and oriented. Well appearing and in no acute distress. Eyes: Conjunctivae are normal. PERRL. EOMI. Head: Atraumatic. Nose: No congestion/rhinnorhea. Neck: No  stridor.   Cardiovascular: Normal rate, regular rhythm. Grossly normal heart sounds.  Good peripheral circulation. Respiratory: Normal respiratory effort.  No retractions. Lungs CTAB. Musculoskeletal: Patient moves upper and lower extremities without any difficulty. Gait was not tested due to age. Neurologic:  Normal speech and language. No gross focal neurologic deficits are appreciated.  Skin:  Skin is warm, dry and intact. No rash noted. Left great toe there is some fungal infection present. Lateral aspect is affected more than medial. There is also a darkened almost brownish-black area lateral aspect included in the fungal infected area. Area is not tender. Skin is not red and appears to be nontender to palpation. Psychiatric: Mood and affect are normal. Speech and behavior are normal.  ____________________________________________   LABS (all labs ordered are listed, but only abnormal results are displayed)  Labs Reviewed - No data to display  PROCEDURES  Procedure(s) performed: None  Critical Care performed: No  ____________________________________________   INITIAL IMPRESSION / ASSESSMENT AND PLAN / ED COURSE  Pertinent labs & imaging results that were available during my care of the patient were reviewed by me and considered in my medical decision making (see chart for details).  Patient husband was instructed to have the podiatrist at [facility] check this area more so because of possible suspicion of a skin cancer. Patient was instructed to do Epsom salts soaks once a day to see if this gives her any relief. She is continue her regular medication as instructed. ____________________________________________   FINAL CLINICAL IMPRESSION(S) / ED DIAGNOSES  Final diagnoses:  Fungal infection of toenail      Tommi Rumps, PA-C 04/02/15 1605  Sharman Cheek, MD 04/05/15 1218

## 2015-04-02 NOTE — ED Notes (Signed)
Pt c/o left great toe pain for couple days. No other complaints. Toenail appears slightly discolored. No swelling or redness noted. No deformity

## 2015-07-24 ENCOUNTER — Observation Stay
Admission: EM | Admit: 2015-07-24 | Discharge: 2015-07-26 | Disposition: A | Discharge status: Skilled Nursing Facility | Payer: Medicare Other | Attending: Internal Medicine | Admitting: Internal Medicine

## 2015-07-24 ENCOUNTER — Emergency Department: Payer: Medicare Other

## 2015-07-24 ENCOUNTER — Encounter: Payer: Self-pay | Admitting: *Deleted

## 2015-07-24 DIAGNOSIS — E7800 Pure hypercholesterolemia, unspecified: Secondary | ICD-10-CM | POA: Insufficient documentation

## 2015-07-24 DIAGNOSIS — S2232XA Fracture of one rib, left side, initial encounter for closed fracture: Secondary | ICD-10-CM

## 2015-07-24 DIAGNOSIS — M25559 Pain in unspecified hip: Secondary | ICD-10-CM

## 2015-07-24 DIAGNOSIS — Z90710 Acquired absence of both cervix and uterus: Secondary | ICD-10-CM | POA: Insufficient documentation

## 2015-07-24 DIAGNOSIS — Z7982 Long term (current) use of aspirin: Secondary | ICD-10-CM | POA: Insufficient documentation

## 2015-07-24 DIAGNOSIS — F0281 Dementia in other diseases classified elsewhere with behavioral disturbance: Secondary | ICD-10-CM | POA: Insufficient documentation

## 2015-07-24 DIAGNOSIS — Z791 Long term (current) use of non-steroidal anti-inflammatories (NSAID): Secondary | ICD-10-CM | POA: Insufficient documentation

## 2015-07-24 DIAGNOSIS — Z9889 Other specified postprocedural states: Secondary | ICD-10-CM | POA: Diagnosis not present

## 2015-07-24 DIAGNOSIS — F0390 Unspecified dementia without behavioral disturbance: Secondary | ICD-10-CM

## 2015-07-24 DIAGNOSIS — F329 Major depressive disorder, single episode, unspecified: Secondary | ICD-10-CM | POA: Insufficient documentation

## 2015-07-24 DIAGNOSIS — S2242XA Multiple fractures of ribs, left side, initial encounter for closed fracture: Secondary | ICD-10-CM | POA: Diagnosis not present

## 2015-07-24 DIAGNOSIS — E039 Hypothyroidism, unspecified: Secondary | ICD-10-CM | POA: Insufficient documentation

## 2015-07-24 DIAGNOSIS — Z66 Do not resuscitate: Secondary | ICD-10-CM | POA: Insufficient documentation

## 2015-07-24 DIAGNOSIS — R079 Chest pain, unspecified: Secondary | ICD-10-CM | POA: Diagnosis present

## 2015-07-24 DIAGNOSIS — R52 Pain, unspecified: Secondary | ICD-10-CM | POA: Diagnosis present

## 2015-07-24 DIAGNOSIS — G309 Alzheimer's disease, unspecified: Secondary | ICD-10-CM | POA: Insufficient documentation

## 2015-07-24 DIAGNOSIS — R0781 Pleurodynia: Secondary | ICD-10-CM | POA: Insufficient documentation

## 2015-07-24 DIAGNOSIS — R262 Difficulty in walking, not elsewhere classified: Secondary | ICD-10-CM

## 2015-07-24 DIAGNOSIS — I10 Essential (primary) hypertension: Secondary | ICD-10-CM

## 2015-07-24 DIAGNOSIS — D649 Anemia, unspecified: Secondary | ICD-10-CM | POA: Diagnosis not present

## 2015-07-24 DIAGNOSIS — W19XXXA Unspecified fall, initial encounter: Secondary | ICD-10-CM | POA: Diagnosis not present

## 2015-07-24 DIAGNOSIS — Z79899 Other long term (current) drug therapy: Secondary | ICD-10-CM | POA: Insufficient documentation

## 2015-07-24 LAB — CBC WITH DIFFERENTIAL/PLATELET
BASOS PCT: 0 %
Basophils Absolute: 0 10*3/uL (ref 0–0.1)
EOS ABS: 0.4 10*3/uL (ref 0–0.7)
EOS PCT: 5 %
HCT: 38.3 % (ref 35.0–47.0)
Hemoglobin: 13.1 g/dL (ref 12.0–16.0)
LYMPHS ABS: 1.4 10*3/uL (ref 1.0–3.6)
Lymphocytes Relative: 17 %
MCH: 30.9 pg (ref 26.0–34.0)
MCHC: 34.2 g/dL (ref 32.0–36.0)
MCV: 90.4 fL (ref 80.0–100.0)
Monocytes Absolute: 0.7 10*3/uL (ref 0.2–0.9)
Monocytes Relative: 8 %
Neutro Abs: 5.7 10*3/uL (ref 1.4–6.5)
Neutrophils Relative %: 70 %
PLATELETS: 191 10*3/uL (ref 150–440)
RBC: 4.24 MIL/uL (ref 3.80–5.20)
RDW: 12.5 % (ref 11.5–14.5)
WBC: 8.1 10*3/uL (ref 3.6–11.0)

## 2015-07-24 LAB — BASIC METABOLIC PANEL
Anion gap: 7 (ref 5–15)
BUN: 22 mg/dL — AB (ref 6–20)
CO2: 26 mmol/L (ref 22–32)
CREATININE: 1.21 mg/dL — AB (ref 0.44–1.00)
Calcium: 9 mg/dL (ref 8.9–10.3)
Chloride: 107 mmol/L (ref 101–111)
GFR, EST AFRICAN AMERICAN: 47 mL/min — AB (ref >60)
GFR, EST NON AFRICAN AMERICAN: 40 mL/min — AB (ref >60)
Glucose, Bld: 104 mg/dL — ABNORMAL HIGH (ref 65–99)
POTASSIUM: 4.2 mmol/L (ref 3.5–5.1)
SODIUM: 140 mmol/L (ref 135–145)

## 2015-07-24 LAB — URINALYSIS COMPLETE WITH MICROSCOPIC (ARMC ONLY)
BACTERIA UA: NONE SEEN
Bilirubin Urine: NEGATIVE
GLUCOSE, UA: NEGATIVE mg/dL
Leukocytes, UA: NEGATIVE
Nitrite: NEGATIVE
Protein, ur: NEGATIVE mg/dL
Specific Gravity, Urine: 1.02 (ref 1.005–1.030)
pH: 5 (ref 5.0–8.0)

## 2015-07-24 LAB — TROPONIN I

## 2015-07-24 LAB — TSH: TSH: 1.045 u[IU]/mL (ref 0.350–4.500)

## 2015-07-24 MED ORDER — ACETAMINOPHEN 500 MG PO TABS
1000.0000 mg | ORAL_TABLET | Freq: Once | ORAL | Status: AC
  Administered 2015-07-24: 1000 mg via ORAL

## 2015-07-24 MED ORDER — ACETAMINOPHEN 500 MG PO TABS
ORAL_TABLET | ORAL | Status: AC
  Administered 2015-07-24: 1000 mg via ORAL
  Filled 2015-07-24: qty 2

## 2015-07-24 MED ORDER — DIVALPROEX SODIUM 125 MG PO CSDR
250.0000 mg | DELAYED_RELEASE_CAPSULE | Freq: Two times a day (BID) | ORAL | Status: DC
  Administered 2015-07-25 – 2015-07-26 (×4): 250 mg via ORAL
  Filled 2015-07-24 (×4): qty 2

## 2015-07-24 MED ORDER — FERROUS SULFATE 325 (65 FE) MG PO TABS
325.0000 mg | ORAL_TABLET | Freq: Every day | ORAL | Status: DC
  Administered 2015-07-25 – 2015-07-26 (×2): 325 mg via ORAL
  Filled 2015-07-24 (×2): qty 1

## 2015-07-24 MED ORDER — MEMANTINE HCL 10 MG PO TABS
10.0000 mg | ORAL_TABLET | Freq: Two times a day (BID) | ORAL | Status: DC
  Administered 2015-07-25 – 2015-07-26 (×4): 10 mg via ORAL
  Filled 2015-07-24 (×4): qty 1

## 2015-07-24 MED ORDER — NAPROXEN 500 MG PO TABS
ORAL_TABLET | ORAL | Status: AC
  Administered 2015-07-24: 250 mg via ORAL
  Filled 2015-07-24: qty 1

## 2015-07-24 MED ORDER — DONEPEZIL HCL 5 MG PO TABS
10.0000 mg | ORAL_TABLET | Freq: Every day | ORAL | Status: DC
  Administered 2015-07-25 (×2): 10 mg via ORAL
  Filled 2015-07-24 (×2): qty 2

## 2015-07-24 MED ORDER — LEVOTHYROXINE SODIUM 75 MCG PO TABS
75.0000 ug | ORAL_TABLET | Freq: Every day | ORAL | Status: DC
  Administered 2015-07-25 – 2015-07-26 (×2): 75 ug via ORAL
  Filled 2015-07-24 (×2): qty 1

## 2015-07-24 MED ORDER — SODIUM CHLORIDE 0.9% FLUSH
3.0000 mL | Freq: Two times a day (BID) | INTRAVENOUS | Status: DC
  Administered 2015-07-25 – 2015-07-26 (×4): 3 mL via INTRAVENOUS

## 2015-07-24 MED ORDER — PANTOPRAZOLE SODIUM 40 MG PO TBEC
40.0000 mg | DELAYED_RELEASE_TABLET | Freq: Every day | ORAL | Status: DC
  Administered 2015-07-25 – 2015-07-26 (×2): 40 mg via ORAL
  Filled 2015-07-24 (×2): qty 1

## 2015-07-24 MED ORDER — POTASSIUM CHLORIDE CRYS ER 20 MEQ PO TBCR
20.0000 meq | EXTENDED_RELEASE_TABLET | Freq: Every day | ORAL | Status: DC
  Administered 2015-07-25 – 2015-07-26 (×2): 20 meq via ORAL
  Filled 2015-07-24 (×2): qty 1

## 2015-07-24 MED ORDER — ONDANSETRON HCL 4 MG/2ML IJ SOLN: 4.0000 mg | Freq: Four times a day (QID) | INTRAMUSCULAR | Status: DC | PRN

## 2015-07-24 MED ORDER — ACETAMINOPHEN 325 MG PO TABS
650.0000 mg | ORAL_TABLET | Freq: Four times a day (QID) | ORAL | Status: DC | PRN
  Administered 2015-07-25: 650 mg via ORAL
  Filled 2015-07-24 (×2): qty 2

## 2015-07-24 MED ORDER — QUETIAPINE FUMARATE 25 MG PO TABS
25.0000 mg | ORAL_TABLET | Freq: Two times a day (BID) | ORAL | Status: DC
  Administered 2015-07-25 – 2015-07-26 (×4): 25 mg via ORAL
  Filled 2015-07-24 (×4): qty 1

## 2015-07-24 MED ORDER — SERTRALINE HCL 50 MG PO TABS
75.0000 mg | ORAL_TABLET | Freq: Every day | ORAL | Status: DC
  Administered 2015-07-25 – 2015-07-26 (×2): 75 mg via ORAL
  Filled 2015-07-24 (×2): qty 2

## 2015-07-24 MED ORDER — LORAZEPAM 0.5 MG PO TABS
0.5000 mg | ORAL_TABLET | Freq: Two times a day (BID) | ORAL | Status: DC | PRN
  Administered 2015-07-25 (×2): 0.5 mg via ORAL
  Filled 2015-07-24 (×2): qty 1

## 2015-07-24 MED ORDER — ENOXAPARIN SODIUM 30 MG/0.3ML ~~LOC~~ SOLN
30.0000 mg | SUBCUTANEOUS | Status: DC
  Administered 2015-07-25: 30 mg via SUBCUTANEOUS
  Filled 2015-07-24: qty 0.3

## 2015-07-24 MED ORDER — NAPROXEN 500 MG PO TABS
250.0000 mg | ORAL_TABLET | Freq: Once | ORAL | Status: AC
  Administered 2015-07-24: 250 mg via ORAL

## 2015-07-24 MED ORDER — SIMVASTATIN 20 MG PO TABS
20.0000 mg | ORAL_TABLET | Freq: Every day | ORAL | Status: DC
  Administered 2015-07-25 (×2): 20 mg via ORAL
  Filled 2015-07-24 (×2): qty 1

## 2015-07-24 MED ORDER — SENNOSIDES-DOCUSATE SODIUM 8.6-50 MG PO TABS: 1.0000 | ORAL_TABLET | Freq: Every evening | ORAL | Status: DC | PRN

## 2015-07-24 MED ORDER — ACETAMINOPHEN 650 MG RE SUPP: 650.0000 mg | Freq: Four times a day (QID) | RECTAL | Status: DC | PRN

## 2015-07-24 MED ORDER — ONDANSETRON HCL 4 MG PO TABS: 4.0000 mg | ORAL_TABLET | Freq: Four times a day (QID) | ORAL | Status: DC | PRN

## 2015-07-24 MED ORDER — ASPIRIN EC 81 MG PO TBEC
81.0000 mg | DELAYED_RELEASE_TABLET | Freq: Every day | ORAL | Status: DC
  Administered 2015-07-25 – 2015-07-26 (×2): 81 mg via ORAL
  Filled 2015-07-24 (×2): qty 1

## 2015-07-24 MED ORDER — HYDRALAZINE HCL 20 MG/ML IJ SOLN
10.0000 mg | Freq: Once | INTRAMUSCULAR | Status: AC
  Administered 2015-07-25: 10 mg via INTRAVENOUS
  Filled 2015-07-24: qty 1

## 2015-07-24 NOTE — ED Notes (Signed)
Pt resting in bed, family at bedside.

## 2015-07-24 NOTE — ED Notes (Signed)
Pt arrives [facility] with an unwitnessed fall, pt from memory care unit, skin tear to right arm, son at bedside

## 2015-07-24 NOTE — ED Notes (Signed)
Patient assisted into a recliner to help with her complaints of pain in the ribs and back.

## 2015-07-24 NOTE — ED Provider Notes (Signed)
[facility] Emergency Department Provider Note   ____________________________________________  Time seen:  Seen upon arrival to the emergency department  I have reviewed the triage vital signs and the nursing notes.   HISTORY  Chief Complaint Fall   HPI Alicia [name] is a 82 y.o. female with a history of dementia who had an unwitnessed fall in her memory care unit. This is her second fall over the past week. Her son is at the bedside and says that she has been slowly weakening over the past month. The patient denies any pain at this time, however en route she was pointing to her left chest as a source for pain. She does not recall the events of the fall and says that she fell while walking in her garden. This is not true as she was in her memory care unit. Her son says that at this time she is acting at her baseline with baseline mental status.   Past Medical History  Diagnosis Date  . High cholesterol   . Low blood potassium   . Thyroid disease     hypothyroidism  . Anemia   . Hypertension   . Dementia     There are no active problems to display for this patient.   Past Surgical History  Procedure Laterality Date  . Abdominal hysterectomy    . Shoulder surgery Right     Current Outpatient Rx  Name  Route  Sig  Dispense  Refill  . donepezil (ARICEPT) 10 MG tablet   Oral   Take 1 tablet by mouth at bedtime.         Marland Kitchen EXPIRED: ferrous sulfate 325 (65 FE) MG EC tablet   Oral   Take 1 tablet by mouth daily.         Marland Kitchen HYDROcodone-acetaminophen (NORCO) 5-325 MG per tablet   Oral   Take 1 tablet by mouth every 6 (six) hours as needed for moderate pain.   15 tablet   0   . ibuprofen (ADVIL,MOTRIN) 600 MG tablet   Oral   Take 1 tablet (600 mg total) by mouth every 8 (eight) hours as needed.   15 tablet   0   . levothyroxine (SYNTHROID, LEVOTHROID) 75 MCG tablet   Oral   Take 1 tablet by mouth every morning.         . meloxicam  (MOBIC) 7.5 MG tablet   Oral   Take 1 tablet by mouth daily.         . memantine (NAMENDA) 10 MG tablet   Oral   Take 1 tablet by mouth daily.         . nitrofurantoin, macrocrystal-monohydrate, (MACROBID) 100 MG capsule   Oral   Take 1 capsule (100 mg total) by mouth 2 (two) times daily.   10 capsule   0   . pantoprazole (PROTONIX) 40 MG tablet   Oral   Take 1 tablet by mouth daily.         . potassium chloride SA (K-DUR,KLOR-CON) 20 MEQ tablet   Oral   Take 1 tablet by mouth daily.         . sertraline (ZOLOFT) 50 MG tablet   Oral   Take 1 tablet by mouth daily.         . simvastatin (ZOCOR) 20 MG tablet   Oral   Take 1 tablet by mouth at bedtime.           Allergies Review of  patient's allergies indicates no known allergies.  Family History  Problem Relation Age of Onset  . Breast cancer Sister 37    Social History Social History  Substance Use Topics  . Smoking status: Never Smoker   . Smokeless tobacco: None  . Alcohol Use: No    Review of Systems Constitutional: No fever/chills Eyes: No visual changes. ENT: No sore throat. Cardiovascular: Left-sided chest pain Respiratory: Denies shortness of breath. Gastrointestinal: No abdominal pain.  No nausea, no vomiting.  No diarrhea.  No constipation. Genitourinary: Negative for dysuria. Musculoskeletal: Negative for back pain. Skin: Negative for rash. Neurological: Negative for headaches, focal weakness or numbness.  10-point ROS otherwise negative.  ____________________________________________   PHYSICAL EXAM:  VITAL SIGNS: ED Triage Vitals  Enc Vitals Group     BP 07/24/15 1614 154/65 mmHg     Pulse Rate 07/24/15 1614 59     Resp 07/24/15 1614 18     Temp 07/24/15 1614 97.8 F (36.6 C)     Temp Source 07/24/15 1614 Oral     SpO2 07/24/15 1614 97 %     Weight 07/24/15 1614 140 lb (63.504 kg)     Height 07/24/15 1614 5' (1.524 m)     Head Cir --      Peak Flow --      Pain  Score --      Pain Loc --      Pain Edu? --      Excl. in GC? --     Constitutional: Alert and oriented to self only. Well appearing and in no acute distress. Eyes: Conjunctivae are normal. PERRL. EOMI. Head: Atraumatic. Nose: No congestion/rhinnorhea. Mouth/Throat: Mucous membranes are moist.   Neck: No stridor.  No tenderness to palpation over the midline C-spine. No deformity or step-off. Cardiovascular: Normal rate, regular rhythm. Grossly normal heart sounds.   Respiratory: Normal respiratory effort.  No retractions. Lungs CTAB. Gastrointestinal: Soft and nontender. No distention. No CVA tenderness. Musculoskeletal: No lower extremity tenderness nor edema.  No joint effusions. Mild tenderness palpation over the proximal femur on the right. No deformity. 5 out of 5 strength bilateral lower extremities with active flexion at the hips bilaterally. Pelvis is stable without any tenderness to palpation. No tenderness to the thoracic or lumbar spines and there is no step-off or deformity. Neurologic:  Normal speech and language. No gross focal neurologic deficits are appreciated.  Skin:  Skin is warm, dry.  Superficial, 1 inch skin tear over the olecranon of the right elbow. The patient has full active range of motion of the right elbow. No active bleeding, no surrounding induration or pus. Psychiatric: Mood and affect are normal. Speech and behavior are normal.  ____________________________________________   LABS (all labs ordered are listed, but only abnormal results are displayed)  Labs Reviewed  CBC WITH DIFFERENTIAL/PLATELET  BASIC METABOLIC PANEL  TROPONIN I  TSH  URINALYSIS COMPLETEWITH MICROSCOPIC ([facility] ONLY)   ____________________________________________  EKG  ED ECG REPORT I, Arelia Longest, the attending physician, personally viewed and interpreted this ECG.   Date: 07/24/2015  EKG Time: 1720  Rate: 55  Rhythm: sinus bradycardia  Axis: Normal   Intervals:none  ST&T Change: T wave inversions in aVL as well as V2. No ST elevation or depression. T-wave inversion in aVL is seen on previous EKGs from 2016. The inversion in V2 is new but this also may be secondary to lead placement.   ____________________________________________  RADIOLOGY   DG HIP UNILAT WITH PELVIS  2-3 VIEWS RIGHT (Final result) Result time: 07/24/15 20:42:00   Final result by Rad Results In Interface (07/24/15 20:42:00)   Narrative:   CLINICAL DATA: Left hip pain after fall.  EXAM: DG HIP (WITH OR WITHOUT PELVIS) 2-3V RIGHT  COMPARISON: Left hip 07/24/2015  FINDINGS: Right hip is located without a fracture. Right pubic rami are intact.  IMPRESSION: No acute abnormality to the right hip.   Electronically Signed By: Richarda Overlie M.D. On: 07/24/2015 20:42          DG HIP UNILAT WITH PELVIS 2-3 VIEWS LEFT (Final result) Result time: 07/24/15 20:39:17   Final result by Rad Results In Interface (07/24/15 20:39:17)   Narrative:   CLINICAL DATA: Fall today with left hip pain, initial encounter  EXAM: DG HIP (WITH OR WITHOUT PELVIS) 2-3V LEFT  COMPARISON: 10/16/2014  FINDINGS: Degenerative changes of left hip joint are noted. No acute fracture or dislocation is seen. No gross soft tissue abnormality is noted.  IMPRESSION: No acute abnormality noted.   Electronically Signed By: Alcide Clever M.D. On: 07/24/2015 20:39          CT Head Wo Contrast (Final result) Result time: 07/24/15 17:07:58   Final result by Rad Results In Interface (07/24/15 17:07:58)   Narrative:   CLINICAL DATA: Recent fall  EXAM: CT HEAD WITHOUT CONTRAST  TECHNIQUE: Contiguous axial images were obtained from the base of the skull through the vertex without intravenous contrast.  COMPARISON: 10/16/2014  FINDINGS: Bony calvarium is intact. Degenerative changes of the left temporomandibular joint are seen and stable. Diffuse  atrophic changes stable from the prior exam. No findings to suggest acute hemorrhage, acute infarction or space-occupying mass lesion are seen. Calcified meningioma is again noted in the right frontoparietal region.  IMPRESSION: No acute abnormality noted. Stable chronic atrophic changes.   Electronically Signed By: Alcide Clever M.D. On: 07/24/2015 17:07          DG Ribs Unilateral W/Chest Left (Final result) Result time: 07/24/15 17:07:30   Final result by Rad Results In Interface (07/24/15 17:07:30)   Narrative:   CLINICAL DATA: Left upper/anterior chest wall pain status post unwitnessed fall.  EXAM: LEFT RIBS AND CHEST - 3+ VIEW  COMPARISON: Chest radiographs 10/23/2013. Thoracic spine radiographs 04/11/2014.  FINDINGS: The bones are mildly demineralized. There are several left lateral rib deformities involving the second through sixth ribs consistent with fractures. Some of these appear healed, while others could be subacute. No displaced rib fracture, pneumothorax or significant pleural effusion identified. The heart size and mediastinal contours are stable. The lungs are clear. Left upper quadrant abdominal calcification is unchanged, corresponding with a renal calcification on prior CT from 2014.  IMPRESSION: Several age indeterminate nondisplaced left-sided rib fractures are present. No evidence of pneumothorax or pleural effusion.   Electronically Signed By: Carey Bullocks M.D. On: 07/24/2015 17:07          DG Pelvis 1-2 Views (Final result) Result time: 07/24/15 17:11:29   Final result by Rad Results In Interface (07/24/15 17:11:29)   Narrative:   CLINICAL DATA: 82 year old female with unwitnessed fall. Pain. Initial encounter.  EXAM: PELVIS - 1-2 VIEW  COMPARISON: Right hip series 01/21/13  FINDINGS: Chronic asymmetric increased sclerosis and osteophytosis about the left hip appears stable. Both proximal femurs appear  grossly stable and intact. Pelvis intact. SI joints appear stable.  IMPRESSION: No acute fracture or dislocation identified about the pelvis. If there is lateralizing hip pain, recommend dedicated hip series.   Electronically Signed By:  Odessa FlemingH Hall M.D. On: 07/24/2015 17:11    ____________________________________________   PROCEDURES   ____________________________________________   INITIAL IMPRESSION / ASSESSMENT AND PLAN / ED COURSE  Pertinent labs & imaging results that were available during my care of the patient were reviewed by me and considered in my medical decision making (see chart for details).  I discussed how much workup to someone prefer he says that he would like a full work up including blood work and urine because of the patient's increased weakness over the past month with 2 falls in the past week. He says that she normally falls once about every 2 weeks.  ----------------------------------------- 9:00 PM on 07/24/2015 -----------------------------------------  Patient given Tylenol 9 Naprosyn for the left-sided rib pain. Says that it is still painful to take a deep breath especially over her left scapula. Patient also with difficulty ambulating with pain to her left hip but with bilaterally negative hip x-rays. We'll admit to the hospital for pain control, physical therapy consultation as well as pulmonary therapy. Signed out to Dr. Elisabeth PigeonVachhani. The patient's family is aware of this plan. Although the rib fractures were read as subacute the patient was not complaining of any rib pain before today. I have to think, because of the clinical aspects of this complaint that the rib fractures are likely new. Unclear cause of the fall but likely mechanical. Other than the radiologic findings the medical workup is largely reassuring for any further pathology. ____________________________________________   FINAL CLINICAL IMPRESSION(S) / ED DIAGNOSES  Left-sided rib  fractures. Fall. Ambulatory dysfunction. Hip pain.    NEW MEDICATIONS STARTED DURING THIS VISIT:  New Prescriptions   No medications on file     Note:  This document was prepared using Dragon voice recognition software and may include unintentional dictation errors.    Myrna Blazeravid Matthew Dameka Younker, MD 07/24/15 2101

## 2015-07-24 NOTE — H&P (Signed)
PCP:   Barbette ReichmannHANDE,VISHWANATH, MD   Chief Complaint:  Fall  HPI: This is a pleasant 82 year old female with severe dementia, residing at the [facility] unit [facility]. Today she fell and hit her head. The patient is a fall risk and did fall 3 days ago but today she hit her head. The protocol and the facilities to transfer patient out who hit their heads. In route the patient indicated that she had chest pains. The hospitalist was requested to admit. On discussing the patient's husband and son were present at bedside the patient does complain of chronic body aches. The patient is unable to give any further information due to her severe dementia.  Review of Systems:  Unable to obtain due to patient's severe dementia  Past Medical History: Past Medical History  Diagnosis Date  . High cholesterol   . Low blood potassium   . Thyroid disease     hypothyroidism  . Anemia   . Hypertension   . Dementia    Past Surgical History  Procedure Laterality Date  . Abdominal hysterectomy    . Shoulder surgery Right     Medications: Prior to Admission medications   Medication Sig Start Date End Date Taking? Authorizing Provider  aspirin EC 81 MG tablet Take 81 mg by mouth daily.   Yes Historical Provider, MD  cyanocobalamin 500 MCG tablet Take 500 mcg by mouth daily.   Yes Historical Provider, MD  divalproex (DEPAKOTE SPRINKLE) 125 MG capsule Take 250 mg by mouth 2 (two) times daily.   Yes Historical Provider, MD  donepezil (ARICEPT) 10 MG tablet Take 10 mg by mouth at bedtime.    Yes Historical Provider, MD  ferrous sulfate 325 (65 FE) MG EC tablet Take 325 mg by mouth daily with breakfast.    Yes Historical Provider, MD  levothyroxine (SYNTHROID, LEVOTHROID) 75 MCG tablet Take 75 mcg by mouth daily before breakfast.    Yes Historical Provider, MD  LORazepam (ATIVAN) 0.5 MG tablet Take 0.5 mg by mouth 2 (two) times daily as needed for anxiety (and/or agitation).   Yes Historical Provider, MD   meloxicam (MOBIC) 7.5 MG tablet Take 7.5 mg by mouth daily.    Yes Historical Provider, MD  memantine (NAMENDA) 10 MG tablet Take 10 mg by mouth 2 (two) times daily.    Yes Historical Provider, MD  pantoprazole (PROTONIX) 40 MG tablet Take 40 mg by mouth daily before breakfast.    Yes Historical Provider, MD  potassium chloride SA (K-DUR,KLOR-CON) 20 MEQ tablet Take 20 mEq by mouth daily.    Yes Historical Provider, MD  QUEtiapine (SEROQUEL) 25 MG tablet Take 25 mg by mouth 2 (two) times daily.   Yes Historical Provider, MD  sertraline (ZOLOFT) 50 MG tablet Take 75 mg by mouth daily.    Yes Historical Provider, MD  simvastatin (ZOCOR) 20 MG tablet Take 20 mg by mouth at bedtime.    Yes Historical Provider, MD    Allergies:  No Known Allergies  Social History:  reports that she has never smoked. She does not have any smokeless tobacco history on file. She reports that she does not drink alcohol. Her drug history is not on file.  Family History: Family History  Problem Relation Age of Onset  . Breast cancer Sister 6360    Physical Exam: Filed Vitals:   07/24/15 1614 07/24/15 1805 07/24/15 2013  BP: 154/65 176/69 165/71  Pulse: 59  65  Temp: 97.8 F (36.6 C)  98.1  F (36.7 C)  TempSrc: Oral  Oral  Resp: Height: 5' (1.524 m)    Weight: 63.504 kg (140 lb)    SpO2: 97% 98% 99%    General: pleasantly demented , well developed and nourished, no acute distress Eyes: PERRLA, pink conjunctiva, no scleral icterus ENT: Moist oral mucosa, neck supple, no thyromegaly Lungs: clear to ascultation, no wheeze, no crackles, no use of accessory muscles Cardiovascular: regular rate and rhythm, no regurgitation, no gallops, no murmurs. No carotid bruits, no JVD, reproducible chest wall pain left sided Abdomen: soft, positive BS, non-tender, non-distended, no organomegaly, not an acute abdomen GU: not examined Neuro: Appears CN II - XII grossly intact, Musculoskeletal: strength 5/5 all  extremities, no clubbing, cyanosis or edema Skin: no rash, no subcutaneous crepitation, no decubitus Psych: pleasantly demented   Labs on Admission:   Recent Labs  07/24/15 1722  NA 140  K 4.2  CL 107  CO2 26  GLUCOSE 104*  BUN 22*  CREATININE 1.21*  CALCIUM 9.0   No results for input(s): AST, ALT, ALKPHOS, BILITOT, PROT, ALBUMIN in the last 72 hours. No results for input(s): LIPASE, AMYLASE in the last 72 hours.  Recent Labs  07/24/15 1722  WBC 8.1  NEUTROABS 5.7  HGB 13.1  HCT 38.3  MCV 90.4  PLT 191    Recent Labs  07/24/15 1722  TROPONINI <0.03   Invalid input(s): POCBNP No results for input(s): DDIMER in the last 72 hours. No results for input(s): HGBA1C in the last 72 hours. No results for input(s): CHOL, HDL, LDLCALC, TRIG, CHOLHDL, LDLDIRECT in the last 72 hours.  Recent Labs  07/24/15 1722  TSH 1.045   No results for input(s): VITAMINB12, FOLATE, FERRITIN, TIBC, IRON, RETICCTPCT in the last 72 hours.  Micro Results: No results found for this or any previous visit (from the past 240 hour(s)).   Radiological Exams on Admission: Dg Ribs Unilateral W/chest Left  07/24/2015  CLINICAL DATA:  Left upper/anterior chest wall pain status post unwitnessed fall. EXAM: LEFT RIBS AND CHEST - 3+ VIEW COMPARISON:  Chest radiographs 10/23/2013. Thoracic spine radiographs 04/11/2014. FINDINGS: The bones are mildly demineralized. There are several left lateral rib deformities involving the second through sixth ribs consistent with fractures. Some of these appear healed, while others could be subacute. No displaced rib fracture, pneumothorax or significant pleural effusion identified. The heart size and mediastinal contours are stable. The lungs are clear. Left upper quadrant abdominal calcification is unchanged, corresponding with a renal calcification on prior CT from 2014. IMPRESSION: Several age indeterminate nondisplaced left-sided rib fractures are present. No  evidence of pneumothorax or pleural effusion. Electronically Signed   By: Carey Bullocks M.D.   On: 07/24/2015 17:07   Dg Pelvis 1-2 Views  07/24/2015  CLINICAL DATA:  82 year old female with unwitnessed fall. Pain. Initial encounter. EXAM: PELVIS - 1-2 VIEW COMPARISON:  Right hip series 01/21/13 FINDINGS: Chronic asymmetric increased sclerosis and osteophytosis about the left hip appears stable. Both proximal femurs appear grossly stable and intact. Pelvis intact. SI joints appear stable. IMPRESSION: No acute fracture or dislocation identified about the pelvis. If there is lateralizing hip pain, recommend dedicated hip series. Electronically Signed   By: Odessa Fleming M.D.   On: 07/24/2015 17:11   Ct Head Wo Contrast  07/24/2015  CLINICAL DATA:  Recent fall EXAM: CT HEAD WITHOUT CONTRAST TECHNIQUE: Contiguous axial images were obtained from the base of the skull through the vertex without intravenous contrast. COMPARISON:  10/16/2014 FINDINGS: Bony calvarium is intact. Degenerative changes of the left temporomandibular joint are seen and stable. Diffuse atrophic changes stable from the prior exam. No findings to suggest acute hemorrhage, acute infarction or space-occupying mass lesion are seen. Calcified meningioma is again noted in the right frontoparietal region. IMPRESSION: No acute abnormality noted.  Stable chronic atrophic changes. Electronically Signed   By: Alcide Clever M.D.   On: 07/24/2015 17:07   Dg Hip Unilat With Pelvis 2-3 Views Left  07/24/2015  CLINICAL DATA:  Fall today with left hip pain, initial encounter EXAM: DG HIP (WITH OR WITHOUT PELVIS) 2-3V LEFT COMPARISON:  10/16/2014 FINDINGS: Degenerative changes of left hip joint are noted. No acute fracture or dislocation is seen. No gross soft tissue abnormality is noted. IMPRESSION: No acute abnormality noted. Electronically Signed   By: Alcide Clever M.D.   On: 07/24/2015 20:39   Dg Hip Unilat With Pelvis 2-3 Views Right  07/24/2015  CLINICAL  DATA:  Left hip pain after fall. EXAM: DG HIP (WITH OR WITHOUT PELVIS) 2-3V RIGHT COMPARISON:  Left hip 07/24/2015 FINDINGS: Right hip is located without a fracture. Right pubic rami are intact. IMPRESSION: No acute abnormality to the right hip. Electronically Signed   By: Richarda Overlie M.D.   On: 07/24/2015 20:42    Assessment/Plan Present on Admission:  . Chest pain/indeterminate age refraction left-sided -Likely due to rib fractures. -Will monitor patient telemetry overnight   Fall -Fall risk  Dyslipidemia -Stable, home medications resumed.  Hypothyroidism -Stable, home medications resumed  HTN -Uncontrolled, home medications resumed. When necessary blood pressure medications ordered  Severe Dementia -Stable, home medications resumed   Alicia [name] 07/24/2015, 9:45 PM

## 2015-07-24 NOTE — ED Notes (Signed)
Patient transported to X-ray 

## 2015-07-25 ENCOUNTER — Observation Stay
Admit: 2015-07-25 | Discharge: 2015-07-25 | Disposition: A | Discharge status: Home / Self Care | Payer: Medicare Other | Attending: Internal Medicine | Admitting: Internal Medicine

## 2015-07-25 DIAGNOSIS — S2242XA Multiple fractures of ribs, left side, initial encounter for closed fracture: Secondary | ICD-10-CM | POA: Diagnosis not present

## 2015-07-25 LAB — BASIC METABOLIC PANEL
Anion gap: 12 (ref 5–15)
BUN: 18 mg/dL (ref 6–20)
CHLORIDE: 104 mmol/L (ref 101–111)
CO2: 22 mmol/L (ref 22–32)
Calcium: 9.2 mg/dL (ref 8.9–10.3)
Creatinine, Ser: 0.78 mg/dL (ref 0.44–1.00)
GFR calc Af Amer: >60 mL/min (ref >60)
GFR calc non Af Amer: >60 mL/min (ref >60)
GLUCOSE: 123 mg/dL — AB (ref 65–99)
POTASSIUM: 3.9 mmol/L (ref 3.5–5.1)
Sodium: 138 mmol/L (ref 135–145)

## 2015-07-25 LAB — ECHOCARDIOGRAM COMPLETE
AV Area mean vel: 1.66 cm2
AV Mean grad: 11 mmHg
AV VEL mean LVOT/AV: 0.59
AV area mean vel ind: 1.18 cm2/m2
AVA: 1.67 cm2
AVAREAVTI: 1.56 cm2
AVAREAVTIIND: 1.18 cm2/m2
AVPG: 17 mmHg
AVPKVEL: 208 cm/s
Ao pk vel: 0.55 m/s
CHL CUP AV PEAK INDEX: 1.11
CHL CUP AV VEL: 1.67
DOP CAL AO MEAN VELOCITY: 157 cm/s
E/e' ratio: 7.92
EWDT: 292 ms
FS: 22 % — AB (ref 28–44)
Height: 60 in
IV/PV OW: 1.31
LA ID, A-P, ES: 37 mm
LA diam index: 2.62 cm/m2
LA vol A4C: 21.9 ml
LA vol index: 22.1 mL/m2
LA vol: 31.1 mL
LEFT ATRIUM END SYS DIAM: 37 mm
LV TDI E'MEDIAL: 6.31
LV e' LATERAL: 8.33 cm/s
LVEEAVG: 7.92
LVEEMED: 7.92
LVOT SV: 76 mL
LVOT VTI: 26.8 cm
LVOT area: 2.84 cm2
LVOT peak VTI: 0.59 cm
LVOT peak grad rest: 5 mmHg
LVOT peak vel: 114 cm/s
LVOTD: 19 mm
MV Dec: 292
MV pk A vel: 104 m/s
MVPKEVEL: 66 m/s
PW: 8.4 mm — AB (ref 0.6–1.1)
RV TAPSE: 21.4 mm
TDI e' lateral: 8.33
VTI: 45.7 cm
Valve area index: 1.18
WEIGHTICAEL: 1662.4 [oz_av]

## 2015-07-25 LAB — CBC
HEMATOCRIT: 42.9 % (ref 35.0–47.0)
HEMOGLOBIN: 14.3 g/dL (ref 12.0–16.0)
MCH: 30.6 pg (ref 26.0–34.0)
MCHC: 33.4 g/dL (ref 32.0–36.0)
MCV: 91.7 fL (ref 80.0–100.0)
Platelets: 158 10*3/uL (ref 150–440)
RBC: 4.68 MIL/uL (ref 3.80–5.20)
RDW: 12.4 % (ref 11.5–14.5)
WBC: 12.1 10*3/uL — AB (ref 3.6–11.0)

## 2015-07-25 LAB — TROPONIN I
Troponin I: 0.07 ng/mL — ABNORMAL HIGH (ref <0.031)
Troponin I: <0.03 ng/mL (ref <0.031)

## 2015-07-25 LAB — MRSA PCR SCREENING: MRSA by PCR: NEGATIVE

## 2015-07-25 MED ORDER — MORPHINE SULFATE (PF) 2 MG/ML IV SOLN
1.0000 mg | INTRAVENOUS | Status: DC | PRN
  Administered 2015-07-25 – 2015-07-26 (×6): 1 mg via INTRAVENOUS
  Filled 2015-07-25 (×5): qty 1

## 2015-07-25 MED ORDER — MORPHINE SULFATE (PF) 2 MG/ML IV SOLN
INTRAVENOUS | Status: AC
  Filled 2015-07-25: qty 1

## 2015-07-25 NOTE — Care Management (Addendum)
Placed in observation due to sustaining a fall and subsequent chest pain.  Imaging shows some rub fractures varying in age and appearance. Patient is a relatively new resident at [facility].  She resides in the memory unit. She has significant dementia.   Her husband has moved into the assisted living unit within the last month.  Patient does not have a walker.  Discussed the possibility of having home health physical therapy at the facility and obtaining a walker.  Spoke with facility and informed that patient is already receiving physical therapy services- having first evalyation 6/9.  Patient is in need of a front wheeled rolling walker.  One will be provided at discharge.  She has never had a walker.  No agency preference.

## 2015-07-25 NOTE — Clinical Social Work Note (Signed)
Clinical Social Work Assessment  Patient Details  Name: Alicia LeachWilma B Mchargue MRN: [id number] Date of Birth: 05/16/1930  Date of referral:  07/25/15               Reason for consult:  Discharge Planning                Permission sought to share information with:  Family Supports Permission granted to share information::     Name::        Agency::     Relationship::   Freida Busman(Allen- Son)  SolicitorContact Information:     Housing/Transportation Living arrangements for the past 2 months:  Assisted Living Facility Source of Information:  Adult Children Patient Interpreter Needed:  None Criminal Activity/Legal Involvement Pertinent to Current Situation/Hospitalization:  No - Comment as needed Significant Relationships:  Adult Children, Other Family Members Lives with:  Facility Resident ([facility]) Do you feel safe going back to the place where you live?  Yes Need for family participation in patient care:  Yes (Comment) Freida Busman([facility]- Son)  Care giving concerns:  Patient is a resident of [facility]    Social Worker assessment / plan:  CSW attempted to complete assessment but patient is asleep in the bed. Per medical chart patient isn't oriented. CSW called patient's son [name]. Introduced herself and her role. Per Freida BusmanAllen patient [facility]. Reported he'd like for her to return at discharge. Granted CSW verbal permission to coordinate discharge with [facility]. FL2 completed. CSW will continue to follow and assist.   Employment status:  Retired Database administratornsurance information:  Managed Medicare PT Recommendations:  Not assessed at this time Information / Referral to community resources:     Patient/Family's Response to care:  Patient's family are in agreement for patient to go back to [facility] at discharge.   Patient/Family's Understanding of and Emotional Response to Diagnosis, Current Treatment, and Prognosis:  Patient's son is appreciative of CSW's  assistance.   Emotional Assessment Appearance:  Appears stated age Attitude/Demeanor/Rapport:   (None) Affect (typically observed):  Accepting, Calm, Pleasant Orientation:  Oriented to Self Alcohol / Substance use:  Not Applicable Psych involvement (Current and /or in the community):  No (Comment)  Discharge Needs  Concerns to be addressed:  Discharge Planning Concerns Readmission within the last 30 days:  No Current discharge risk:  Chronically ill Barriers to Discharge:  Continued Medical Work up   WalgreenChristina E Valkyrie Guardiola, LCSW 07/25/2015, 4:28 PM
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
## 2015-07-25 NOTE — Progress Notes (Signed)
Pt lying in bed yelling out and sobbing uncontrollably. MD Dr. Joneen Roachrosley notified, orders given for prn morphine. RN will administer and continue to monitor. Syliva Overmanassie A Solina Heron, RN

## 2015-07-25 NOTE — Evaluation (Signed)
Physical Therapy Evaluation Patient Details Name: Alicia [name] MRN: [id number] DOB: [birth year] Today's Date: 07/25/2015   History of Present Illness  82 y/o female with dementia.  Had a fall recently with L side pain/rib fx.  She returns with chest pain.  Clinical Impression  Pt confused t/o the session but was able to participate with basic instructions and repeated cuing.  She was unable to rate pain, but clearly was hurting in her L shoulder/upper rib area.  She initially was very unsafe with standing/walker use, but did improve with time and was able to take a few bouts of forward/backward steps.  She is fatigued with the effort, but her O2 remains in the high 90s on room air.  Pt apparently does not normally use a walker, but her safety/stability would improve significantly if she were able to learn to use one appropriately.      Follow Up Recommendations SNF (or likely "HHPT" in memory care unit)    Equipment Recommendations  Rolling walker with 5" wheels (husband unsure if they have a walker?)    Recommendations for Other Services       Precautions / Restrictions Precautions Precautions: Fall Restrictions Weight Bearing Restrictions: No      Mobility  Bed Mobility Overal bed mobility: Needs Assistance Bed Mobility: Sit to Supine;Supine to Sit     Supine to sit: Mod assist Sit to supine: Max assist;Mod assist   General bed mobility comments: Pt shows some effort, but is not really able to get close to sitting w/o considerable assist  Transfers Overall transfer level: Needs assistance Equipment used: Rolling walker (2 wheeled) Transfers: Sit to/from Stand Sit to Stand: Mod assist;Min assist         General transfer comment: Pt initially confused, does eventually show effort in getting to standing with heavy lean with back of legs on bed.  Ambulation/Gait Ambulation/Gait assistance: Mod assist Ambulation Distance (Feet): 6 Feet Assistive device: Rolling walker  (2 wheeled)       General Gait Details: Pt intially very unsteady and reliant on the walker.  She does eventually seem to understand using the walker and shows some ability to control it.  We only took a few dorward and back bouts secondary to confusion, pain and weakness.   Stairs            Wheelchair Mobility    Modified Rankin (Stroke Patients Only)       Balance Overall balance assessment: Needs assistance   Sitting balance-Leahy Scale: Fair       Standing balance-Leahy Scale: Poor                               Pertinent Vitals/Pain Pain Assessment:  (unable to rate, L shoulder/chest pain)    Home Living Family/patient expects to be discharged to:: Skilled nursing facility                      Prior Function Level of Independence: Needs assistance         Comments: apparently pt is able to ambulate w/o AD on the memory care unit.      Hand Dominance        Extremity/Trunk Assessment   Upper Extremity Assessment: Generalized weakness;Difficult to assess due to impaired cognition (L very limited, R grossly 3+/5)           Lower Extremity Assessment: Generalized weakness;Difficult to assess due to  impaired cognition (b/l LEs appear grossly 4-/5 t/o as able to test)         Communication   Communication:  (confusion makes full communication difficult)  Cognition Arousal/Alertness: Lethargic Behavior During Therapy: Restless Overall Cognitive Status: History of cognitive impairments - at baseline                      General Comments      Exercises        Assessment/Plan    PT Assessment Patient needs continued PT services  PT Diagnosis Difficulty walking;Generalized weakness   PT Problem List Decreased strength;Decreased range of motion;Decreased activity tolerance;Decreased balance;Decreased mobility;Decreased coordination;Decreased cognition;Decreased knowledge of use of DME;Pain  PT Treatment  Interventions DME instruction;Gait training;Functional mobility training;Therapeutic activities;Therapeutic exercise;Balance training;Neuromuscular re-education;Cognitive remediation;Patient/family education   PT Goals (Current goals can be found in the Care Plan section) Acute Rehab PT Goals Patient Stated Goal: husband wants her to get back over to [facility] PT Goal Formulation: With family Time For Goal Achievement: 08/08/15 Potential to Achieve Goals: Fair    Frequency Min 2X/week   Barriers to discharge        Co-evaluation               End of Session Equipment Utilized During Treatment: Gait belt Activity Tolerance: Patient limited by lethargy (mental status limitations)        Functional Assessment Tool Used: clinical judgement Functional Limitation: Mobility: Walking and moving around Mobility: Walking and Moving Around Current Status [phone](G8978): At least 60 percent but less than 80 percent impaired, limited or restricted Mobility: Walking and Moving Around Goal Status [phone](G8979): At least 20 percent but less than 40 percent impaired, limited or restricted    Time: [id number] PT Time Calculation (min) (ACUTE ONLY): 19 min   Charges:   PT Evaluation $PT Eval Low Complexity: 1 Procedure     PT G Codes:   PT G-Codes **NOT FOR INPATIENT CLASS** Functional Assessment Tool Used: clinical judgement Functional Limitation: Mobility: Walking and moving around Mobility: Walking and Moving Around Current Status ([id number](G8978): At least 60 percent but less than 80 percent impaired, limited or restricted Mobility: Walking and Moving Around Goal Status [phone](G8979): At least 20 percent but less than 40 percent impaired, limited or restricted    Malachi ProGalen R Kentrel Clevenger, DPT  07/25/2015, 4:52 PM

## 2015-07-25 NOTE — Care Management Obs Status (Signed)
MEDICARE OBSERVATION STATUS NOTIFICATION   Patient Details  Name: Alicia LeachWilma B Pustejovsky MRN: [id number] Date of Birth: 06/10/1930   Medicare Observation Status Notification Given:  Yes  Son signed.  Found that patient has dementia    Eber HongGreene, Rory Montel R, RN 07/25/2015, 10:04 AM

## 2015-07-25 NOTE — Progress Notes (Signed)
[facility] [place] [facility]   PATIENT NAME: Alicia SpragueWilma [name]    MR#:  [id number]  DATE OF BIRTH:  02/18/1930  SUBJECTIVE: Admitted for fall and rib fractures on the left side. She has severe dementia. Spoke with the son he is requesting physical therapy. Patient troponin was marginally elevated to 0.07. Ordered echocardiogram.   CHIEF COMPLAINT:   Chief Complaint  Patient presents with  . Fall    REVIEW OF SYSTEMS:    Review of Systems  Unable to perform ROS: dementia    Nutrition: Tolerating Diet: Tolerating PT:      DRUG ALLERGIES:  No Known Allergies  VITALS:  Blood pressure 165/72, pulse 60, temperature 97.8 F (36.6 C), temperature source Oral, resp. rate 18, height 5' (1.524 m), weight 47.129 kg (103 lb 14.4 oz), SpO2 97 %.  PHYSICAL EXAMINATION:   Physical Exam  GENERAL:  82 y.o.-year-old patient lying in the bed with no acute distress.  EYES: Pupils equal, round, reactive to light and accommodation. No scleral icterus. Extraocular muscles intact.  HEENT: Head atraumatic, normocephalic. Oropharynx and nasopharynx clear.  NECK:  Supple, no jugular venous distention. No thyroid enlargement, no tenderness.  LUNGS: Normal breath sounds bilaterally, no wheezing, rales,rhonchi or crepitation. No use of accessory muscles of respiration.  CARDIOVASCULAR: S1, S2 normal. No murmurs, rubs, or gallops.  ABDOMEN: Soft, nontender, nondistended. Bowel sounds present. No organomegaly or mass.  EXTREMITIES: No pedal edema, cyanosis, or clubbing.  NEUROLOGIC: Unable to do  Full  neurological exam secondary to dementia PSYCHIATRIC: The patient is alert ,awake,baseline dementia,.  SKIN: No obvious rash, lesion, or ulcer.    LABORATORY PANEL:   CBC  Recent Labs Lab 07/25/15 0635  WBC 12.1*  HGB 14.3  HCT 42.9  PLT 158    ------------------------------------------------------------------------------------------------------------------  Chemistries   Recent Labs Lab 07/25/15 0635  NA 138  K 3.9  CL 104  CO2 22  GLUCOSE 123*  BUN 18  CREATININE 0.78  CALCIUM 9.2   ------------------------------------------------------------------------------------------------------------------  Cardiac Enzymes  Recent Labs Lab 07/25/15 0635  TROPONINI 0.07*   ------------------------------------------------------------------------------------------------------------------  RADIOLOGY:  Dg Ribs Unilateral W/chest Left  07/24/2015  CLINICAL DATA:  Left upper/anterior chest wall pain status post unwitnessed fall. EXAM: LEFT RIBS AND CHEST - 3+ VIEW COMPARISON:  Chest radiographs 10/23/2013. Thoracic spine radiographs 04/11/2014. FINDINGS: The bones are mildly demineralized. There are several left lateral rib deformities involving the second through sixth ribs consistent with fractures. Some of these appear healed, while others could be subacute. No displaced rib fracture, pneumothorax or significant pleural effusion identified. The heart size and mediastinal contours are stable. The lungs are clear. Left upper quadrant abdominal calcification is unchanged, corresponding with a renal calcification on prior CT from 2014. IMPRESSION: Several age indeterminate nondisplaced left-sided rib fractures are present. No evidence of pneumothorax or pleural effusion. Electronically Signed   By: Carey BullocksWilliam  Veazey M.D.   On: 07/24/2015 17:07   Dg Pelvis 1-2 Views  07/24/2015  CLINICAL DATA:  82 year old female with unwitnessed fall. Pain. Initial encounter. EXAM: PELVIS - 1-2 VIEW COMPARISON:  Right hip series 01/21/13 FINDINGS: Chronic asymmetric increased sclerosis and osteophytosis about the left hip appears stable. Both proximal femurs appear grossly stable and intact. Pelvis intact. SI joints appear stable. IMPRESSION: No acute  fracture or dislocation identified about the pelvis. If there is lateralizing hip pain, recommend dedicated hip series. Electronically Signed   By: Odessa FlemingH  Hall M.D.   On: 07/24/2015 17:11   Ct  Head Wo Contrast  07/24/2015  CLINICAL DATA:  Recent fall EXAM: CT HEAD WITHOUT CONTRAST TECHNIQUE: Contiguous axial images were obtained from the base of the skull through the vertex without intravenous contrast. COMPARISON:  10/16/2014 FINDINGS: Bony calvarium is intact. Degenerative changes of the left temporomandibular joint are seen and stable. Diffuse atrophic changes stable from the prior exam. No findings to suggest acute hemorrhage, acute infarction or space-occupying mass lesion are seen. Calcified meningioma is again noted in the right frontoparietal region. IMPRESSION: No acute abnormality noted.  Stable chronic atrophic changes. Electronically Signed   By: Alcide Clever M.D.   On: 07/24/2015 17:07   Dg Hip Unilat With Pelvis 2-3 Views Left  07/24/2015  CLINICAL DATA:  Fall today with left hip pain, initial encounter EXAM: DG HIP (WITH OR WITHOUT PELVIS) 2-3V LEFT COMPARISON:  10/16/2014 FINDINGS: Degenerative changes of left hip joint are noted. No acute fracture or dislocation is seen. No gross soft tissue abnormality is noted. IMPRESSION: No acute abnormality noted. Electronically Signed   By: Alcide Clever M.D.   On: 07/24/2015 20:39   Dg Hip Unilat With Pelvis 2-3 Views Right  07/24/2015  CLINICAL DATA:  Left hip pain after fall. EXAM: DG HIP (WITH OR WITHOUT PELVIS) 2-3V RIGHT COMPARISON:  Left hip 07/24/2015 FINDINGS: Right hip is located without a fracture. Right pubic rami are intact. IMPRESSION: No acute abnormality to the right hip. Electronically Signed   By: Richarda Overlie M.D.   On: 07/24/2015 20:42     ASSESSMENT AND PLAN:   Active Problems:   Chest pain   Dementia   Hypothyroidism   Fall   HTN (hypertension)   #1: Recurrent falls likely due to dementia: sHe had a fall 3 days ago.  Patient CT head unremarkable on admission. Chest x-ray showed large left-sided rib fractures. Hip x-rays are negative. Troponin slightly elevated to 0.07. Physical therapy was consulted for evaluation. Patient does not use walker or cane at baseline. Physical therapy consult to evaluate further ambulatory needs. Continue morphine IVI 1 mg every 4 hours when necessary for pain today. And changed to Percocet tomorrow. #2 depression, dementia continues Seroquel, Zoloft, Aricept, Depakote. #3 agitation use Ativan as needed. #4 dementia with behavioral disturbances: Patient is on Depakote, Aricept, Namenda, Ativan,seroquel #5 is slightly elevated troponins of unclear significance: Check echocardiogram. Continue aspirin, statins. Plan to get physical therapy consult and check echocardiogram. #6 essential hypertension: Started on hydralazine. CODE STATUS DO NOT RESUSCITATE. Discussed with the son,    All the records are reviewed and case discussed with Care Management/Social Workerr. Management plans discussed with the patient, family and they are in agreement.  CODE STATUS: DNR  TOTAL TIME TAKING CARE OF THIS PATIENT: 35 minutes.   POSSIBLE D/C IN 1-2 DAYS, DEPENDING ON CLINICAL CONDITION.   Katha Hamming M.D on 07/25/2015 at 11:24 AM  Between 7am to 6pm - Pager - [phone]  After 6pm go to [email or web address] - password [email or web address] [facility]  [facility]  Office  [phone]  CC: Primary care physician; Barbette Reichmann, MD

## 2015-07-26 DIAGNOSIS — S2242XA Multiple fractures of ribs, left side, initial encounter for closed fracture: Secondary | ICD-10-CM | POA: Diagnosis not present

## 2015-07-26 MED ORDER — HYDROCODONE-ACETAMINOPHEN 5-325 MG PO TABS: 1.0000 | ORAL_TABLET | Freq: Four times a day (QID) | ORAL | Status: DC | PRN

## 2015-07-26 MED ORDER — LORAZEPAM 0.5 MG PO TABS: 0.5000 mg | ORAL_TABLET | Freq: Two times a day (BID) | ORAL | Status: DC | PRN

## 2015-07-26 NOTE — Discharge Summary (Signed)
Alicia LeachWilma B [name], is a 82 y.o. female  DOB 11/07/1930  MRN [id number].  Admission date:  07/24/2015  Admitting Physician  Gery Prayebby Crosley, MD  Discharge Date:  07/26/2015   Primary MD  Barbette ReichmannHANDE,VISHWANATH, MD  Recommendations for primary care physician for things to follow:  Follow-up with primary doctor in 1 week   Admission Diagnosis  Pain [R52] Fall, initial encounter [W19.XXXA] Hip pain, unspecified laterality [M25.559] Rib fractures, left, closed, initial encounter [S22.32XA] Ambulatory dysfunction [R26.2]   Discharge Diagnosis  Pain [R52] Fall, initial encounter [W19.XXXA] Hip pain, unspecified laterality [M25.559] Rib fractures, left, closed, initial encounter [S22.32XA] Ambulatory dysfunction [R26.2]   Active Problems:   Chest pain   Dementia   Hypothyroidism   Fall   HTN (hypertension)      Past Medical History  Diagnosis Date  . High cholesterol   . Low blood potassium   . Thyroid disease     hypothyroidism  . Anemia   . Hypertension   . Dementia     Past Surgical History  Procedure Laterality Date  . Abdominal hysterectomy    . Shoulder surgery Right        History of present illness and  Hospital Course:     Kindly see H&P for history of present illness and admission details, please review complete Labs, Consult reports and Test reports for all details in brief  HPI  from the history and physical done on the day of admission 82 year old female patient with severe dementia  ,from memory care unit,brought in because of fall   And  she hit her head.. Patient complaint of chest pain so she is admitted to telemetry for chest pain observation.  Hospital Course   1 fall with left-sided rib fractures causing chest pain, pleuritic chest pain on the left side. Chest x-ray did not show any  pneumothorax or pleural effusion. Patient started on pain medication, physical therapy recommended home health physical therapy, rolling walker. Patient feels much better today discharge her back to home place today. Patient will get Norco 06/14/2023 milligrams every 6 hours as needed. 30 tablets are prescribed. 2. severe Alzheimer's dementia. #3. Depression, agitation, dementia; continue Depakote, Aricept, Namenda, Ativan, Seroquel, Zoloft #4. essential hypertension: Controlled.  #5 slightly elevated troponins without chest pain likely pleuritic chest pain due to fall and left-sided rib fractures: she  did have echocardiogram.   Discharge Condition:    Follow UP  Follow-up Information    [facility]   Specialty:  Internal Medicine   Contact information:   [address] [facility] [place] [id number] [phone]         Discharge Instructions  and  Discharge Medications       Medication List    TAKE these medications        aspirin EC 81 MG tablet  Take 81 mg by mouth daily.     cyanocobalamin 500 MCG tablet  Take 500 mcg by mouth daily.     divalproex 125 MG capsule  Commonly known as:  DEPAKOTE SPRINKLE  Take 250 mg by mouth 2 (two) times daily.     donepezil 10 MG tablet  Commonly known as:  ARICEPT  Take 10 mg by mouth at bedtime.     ferrous sulfate 325 (65 FE) MG EC tablet  Take 325 mg by mouth daily with breakfast.     HYDROcodone-acetaminophen 5-325 MG tablet  Commonly known as:  NORCO  Take  1 tablet by mouth every 6 (six) hours as needed for moderate pain.     levothyroxine 75 MCG tablet  Commonly known as:  SYNTHROID, LEVOTHROID  Take 75 mcg by mouth daily before breakfast.     LORazepam 0.5 MG tablet  Commonly known as:  ATIVAN  Take 1 tablet (0.5 mg total) by mouth 2 (two) times daily as needed for anxiety (and/or agitation).     meloxicam 7.5 MG tablet  Commonly known as:  MOBIC  Take  7.5 mg by mouth daily.     memantine 10 MG tablet  Commonly known as:  NAMENDA  Take 10 mg by mouth 2 (two) times daily.     pantoprazole 40 MG tablet  Commonly known as:  PROTONIX  Take 40 mg by mouth daily before breakfast.     potassium chloride SA 20 MEQ tablet  Commonly known as:  K-DUR,KLOR-CON  Take 20 mEq by mouth daily.     QUEtiapine 25 MG tablet  Commonly known as:  SEROQUEL  Take 25 mg by mouth 2 (two) times daily.     sertraline 50 MG tablet  Commonly known as:  ZOLOFT  Take 75 mg by mouth daily.     simvastatin 20 MG tablet  Commonly known as:  ZOCOR  Take 20 mg by mouth at bedtime.          Diet and Activity recommendation: See Discharge Instructions above   Consults obtained -Physical therapy, social worker   Major procedures and Radiology Reports - PLEASE review detailed and final reports for all details, in brief -      Dg Ribs Unilateral W/chest Left  07/24/2015  CLINICAL DATA:  Left upper/anterior chest wall pain status post unwitnessed fall. EXAM: LEFT RIBS AND CHEST - 3+ VIEW COMPARISON:  Chest radiographs 10/23/2013. Thoracic spine radiographs 04/11/2014. FINDINGS: The bones are mildly demineralized. There are several left lateral rib deformities involving the second through sixth ribs consistent with fractures. Some of these appear healed, while others could be subacute. No displaced rib fracture, pneumothorax or significant pleural effusion identified. The heart size and mediastinal contours are stable. The lungs are clear. Left upper quadrant abdominal calcification is unchanged, corresponding with a renal calcification on prior CT from 2014. IMPRESSION: Several age indeterminate nondisplaced left-sided rib fractures are present. No evidence of pneumothorax or pleural effusion. Electronically Signed   By: Carey Bullocks M.D.   On: 07/24/2015 17:07   Dg Pelvis 1-2 Views  07/24/2015  CLINICAL DATA:  83 year old female with unwitnessed fall. Pain.  Initial encounter. EXAM: PELVIS - 1-2 VIEW COMPARISON:  Right hip series 01/21/13 FINDINGS: Chronic asymmetric increased sclerosis and osteophytosis about the left hip appears stable. Both proximal femurs appear grossly stable and intact. Pelvis intact. SI joints appear stable. IMPRESSION: No acute fracture or dislocation identified about the pelvis. If there is lateralizing hip pain, recommend dedicated hip series. Electronically Signed   By: Odessa Fleming M.D.   On: 07/24/2015 17:11   Ct Head Wo Contrast  07/24/2015  CLINICAL DATA:  Recent fall EXAM: CT HEAD WITHOUT CONTRAST TECHNIQUE: Contiguous axial images were obtained from the base of the skull through the vertex without intravenous contrast. COMPARISON:  10/16/2014 FINDINGS: Bony calvarium is intact. Degenerative changes of the left temporomandibular joint are seen and stable. Diffuse atrophic changes stable from the prior exam. No findings to suggest acute hemorrhage, acute infarction or space-occupying mass lesion are seen. Calcified meningioma is again noted in the right frontoparietal region. IMPRESSION:  No acute abnormality noted.  Stable chronic atrophic changes. Electronically Signed   By: Alcide Clever M.D.   On: 07/24/2015 17:07   Dg Hip Unilat With Pelvis 2-3 Views Left  07/24/2015  CLINICAL DATA:  Fall today with left hip pain, initial encounter EXAM: DG HIP (WITH OR WITHOUT PELVIS) 2-3V LEFT COMPARISON:  10/16/2014 FINDINGS: Degenerative changes of left hip joint are noted. No acute fracture or dislocation is seen. No gross soft tissue abnormality is noted. IMPRESSION: No acute abnormality noted. Electronically Signed   By: Alcide Clever M.D.   On: 07/24/2015 20:39   Dg Hip Unilat With Pelvis 2-3 Views Right  07/24/2015  CLINICAL DATA:  Left hip pain after fall. EXAM: DG HIP (WITH OR WITHOUT PELVIS) 2-3V RIGHT COMPARISON:  Left hip 07/24/2015 FINDINGS: Right hip is located without a fracture. Right pubic rami are intact. IMPRESSION: No acute  abnormality to the right hip. Electronically Signed   By: Richarda Overlie M.D.   On: 07/24/2015 20:42    Micro Results     Recent Results (from the past 240 hour(s))  MRSA PCR Screening     Status: None   Collection Time: 07/24/15 11:58 PM  Result Value Ref Range Status   MRSA by PCR NEGATIVE NEGATIVE Final    Comment:        The GeneXpert MRSA Assay (FDA approved for NASAL specimens only), is one component of a comprehensive MRSA colonization surveillance program. It is not intended to diagnose MRSA infection nor to guide or monitor treatment for MRSA infections.        Today   Subjective:   Alicia [name] todaySeen today, has dementia and she is at her baseline. because of recurrent falls. Patient seen by physical therapy they recommended rolling walker. Patient does not use any walker or cane before she came. at baseline but because of her dementia she will have falls in future also. I did discuss this with patient's son yesterday.  Objective:   Blood pressure 152/66, pulse 56, temperature 97.8 F (36.6 C), temperature source Oral, resp. rate 17, height 5' (1.524 m), weight 47.129 kg (103 lb 14.4 oz), SpO2 94 %.   Intake/Output Summary (Last 24 hours) at 07/26/15 0917 Last data filed at 07/25/15 1900  Gross per 24 hour  Intake    240 ml  Output    550 ml  Net   -310 ml    Exam Awake Alert, Oriented x 3, No new F.N deficits, Normal affect [place].AT,PERRAL Supple Neck,No JVD, No cervical lymphadenopathy appriciated.  Symmetrical Chest wall movement, Good air movement bilaterally, CTAB RRR,No Gallops,Rubs or new Murmurs, No Parasternal Heave +ve B.Sounds, Abd Soft, Non tender, No organomegaly appriciated, No rebound -guarding or rigidity. No Cyanosis, Clubbing or edema, No new Rash or bruise  Data Review   CBC w Diff: Lab Results  Component Value Date   WBC 12.1* 07/25/2015   WBC 8.1 05/04/2014   HGB 14.3 07/25/2015   HGB 11.4* 05/04/2014   HCT 42.9 07/25/2015    HCT 36.2 05/04/2014   PLT 158 07/25/2015   PLT 273 05/04/2014   LYMPHOPCT 17 07/24/2015   LYMPHOPCT 16.1 05/04/2014   MONOPCT 8 07/24/2015   MONOPCT 8.5 05/04/2014   EOSPCT 5 07/24/2015   EOSPCT 3.2 05/04/2014   BASOPCT 0 07/24/2015   BASOPCT 0.6 05/04/2014    CMP: Lab Results  Component Value Date   NA 138 07/25/2015   NA 138 05/04/2014   K 3.9 07/25/2015  K 2.9* 05/04/2014   CL 104 07/25/2015   CL 105 05/04/2014   CO2 22 07/25/2015   CO2 25 05/04/2014   BUN 18 07/25/2015   BUN 11 05/04/2014   CREATININE 0.78 07/25/2015   CREATININE 1.10* 05/04/2014   PROT 6.9 01/11/2013   ALBUMIN 3.6 01/11/2013   BILITOT 0.2 01/11/2013   ALKPHOS 149* 01/11/2013   AST 26 01/11/2013   ALT 22 01/11/2013  .   Total Time in preparing paper work, data evaluation and todays exam - 35 minutes  Alicia [name] M.D on 07/26/2015 at 9:17 AM    Note: This dictation was prepared with Dragon dictation along with smaller phrase technology. Any transcriptional errors that result from this process are unintentional.

## 2015-07-26 NOTE — Progress Notes (Signed)
Clinical Social Worker informed that patient will be medically ready to discharge to [facility]. Patient's son [name] is in a agreement with plan. CSW called [facility] to confirm that patient's bed is ready. Reported they will transport patient at 2:30 PM. All discharge information added to discharge packet. [facility].  [name], MSW, LCSW-A Clinical Social Work Department [phone]

## 2015-07-26 NOTE — Care Management (Signed)
Attending states patient will discharge today.  She will provide script for walker.  Notified [facility].  Patient to continue physical therapy at the facility.

## 2015-07-26 NOTE — [place] FL2 (Signed)
[place] MEDICAID FL2 LEVEL OF CARE SCREENING TOOL     IDENTIFICATION  Patient Name: Alicia [name] Birthdate: 1930-11-22 Sex: female Admission Date (Current Location): 07/24/2015  [facility] and [place] Number:  Chiropodist and Address:  [facility], [address] [place], [place] [address]      Provider Number: [id number]  Attending Physician Name and Address:  Katha Hamming, MD  Relative Name and Phone Number:       Current Level of Care: Hospital Recommended Level of Care: Assisted Living Facility, Memory Care Prior Approval Number:    Date Approved/Denied:   PASRR Number:    Discharge Plan:  (ALF/Memory Care)    Current Diagnoses: Patient Active Problem List   Diagnosis Date Noted  . Chest pain 07/24/2015  . Dementia 07/24/2015  . Hypothyroidism 07/24/2015  . Fall 07/24/2015  . HTN (hypertension) 07/24/2015    Orientation RESPIRATION BLADDER Height & Weight     Self  Normal Incontinent Weight: 103 lb 14.4 oz (47.129 kg) Height:  5' (152.4 cm)  BEHAVIORAL SYMPTOMS/MOOD NEUROLOGICAL BOWEL NUTRITION STATUS   (none)  (none) Continent Diet (heart healthy)  AMBULATORY STATUS COMMUNICATION OF NEEDS Skin   Limited Assist Verbally Normal                       Personal Care Assistance Level of Assistance  Bathing, Feeding, Dressing Bathing Assistance: Limited assistance Feeding assistance: Limited assistance Dressing Assistance: Limited assistance     Functional Limitations Info  Sight, Hearing Sight Info: Impaired Hearing Info: Impaired      SPECIAL CARE FACTORS FREQUENCY                       Contractures Contractures Info: Not present    Additional Factors Info  Code Status, Allergies Code Status Info: dnr Allergies Info: nka            Discharge Medications: Medication List    TAKE these medications       aspirin EC 81 MG tablet  Take 81 mg by mouth daily.      cyanocobalamin 500 MCG tablet  Take 500 mcg by mouth daily.     divalproex 125 MG capsule  Commonly known as: DEPAKOTE SPRINKLE  Take 250 mg by mouth 2 (two) times daily.     donepezil 10 MG tablet  Commonly known as: ARICEPT  Take 10 mg by mouth at bedtime.     ferrous sulfate 325 (65 FE) MG EC tablet  Take 325 mg by mouth daily with breakfast.     HYDROcodone-acetaminophen 5-325 MG tablet  Commonly known as: NORCO  Take 1 tablet by mouth every 6 (six) hours as needed for moderate pain.     levothyroxine 75 MCG tablet  Commonly known as: SYNTHROID, LEVOTHROID  Take 75 mcg by mouth daily before breakfast.     LORazepam 0.5 MG tablet  Commonly known as: ATIVAN  Take 1 tablet (0.5 mg total) by mouth 2 (two) times daily as needed for anxiety (and/or agitation).     meloxicam 7.5 MG tablet  Commonly known as: MOBIC  Take 7.5 mg by mouth daily.     memantine 10 MG tablet  Commonly known as: NAMENDA  Take 10 mg by mouth 2 (two) times daily.     pantoprazole 40 MG tablet  Commonly known as: PROTONIX  Take 40 mg by mouth daily before breakfast.     potassium chloride SA  20 MEQ tablet  Commonly known as: K-DUR,KLOR-CON  Take 20 mEq by mouth daily.     QUEtiapine 25 MG tablet  Commonly known as: SEROQUEL  Take 25 mg by mouth 2 (two) times daily.     sertraline 50 MG tablet  Commonly known as: ZOLOFT  Take 75 mg by mouth daily.     simvastatin 20 MG tablet  Commonly known as: ZOCOR  Take 20 mg by mouth at bedtime.             Relevant Imaging Results:  Relevant Lab Results:   Additional Information    Verta EllenChristina E Macguire Holsinger, LCSW

## 2015-07-26 NOTE — Progress Notes (Signed)
Removed telemetry and removed IV.  Packet made for facility which includes discharge paperwork, prescriptions.  Family at bedside.  Facility is transporting patient at approx 1430

## 2015-07-28 ENCOUNTER — Emergency Department: Payer: Medicare Other

## 2015-07-28 ENCOUNTER — Observation Stay
Admission: EM | Admit: 2015-07-28 | Discharge: 2015-07-31 | Disposition: A | Discharge status: Skilled Nursing Facility | Payer: Medicare Other | Attending: Internal Medicine | Admitting: Internal Medicine

## 2015-07-28 ENCOUNTER — Encounter: Payer: Self-pay | Admitting: Emergency Medicine

## 2015-07-28 DIAGNOSIS — Z79899 Other long term (current) drug therapy: Secondary | ICD-10-CM | POA: Diagnosis not present

## 2015-07-28 DIAGNOSIS — F0281 Dementia in other diseases classified elsewhere with behavioral disturbance: Secondary | ICD-10-CM | POA: Diagnosis not present

## 2015-07-28 DIAGNOSIS — T424X5A Adverse effect of benzodiazepines, initial encounter: Secondary | ICD-10-CM | POA: Insufficient documentation

## 2015-07-28 DIAGNOSIS — Z7982 Long term (current) use of aspirin: Secondary | ICD-10-CM | POA: Insufficient documentation

## 2015-07-28 DIAGNOSIS — Z79891 Long term (current) use of opiate analgesic: Secondary | ICD-10-CM | POA: Diagnosis not present

## 2015-07-28 DIAGNOSIS — W19XXXA Unspecified fall, initial encounter: Secondary | ICD-10-CM | POA: Diagnosis not present

## 2015-07-28 DIAGNOSIS — E860 Dehydration: Secondary | ICD-10-CM | POA: Insufficient documentation

## 2015-07-28 DIAGNOSIS — T40605A Adverse effect of unspecified narcotics, initial encounter: Secondary | ICD-10-CM | POA: Insufficient documentation

## 2015-07-28 DIAGNOSIS — R569 Unspecified convulsions: Secondary | ICD-10-CM | POA: Insufficient documentation

## 2015-07-28 DIAGNOSIS — E7800 Pure hypercholesterolemia, unspecified: Secondary | ICD-10-CM | POA: Diagnosis not present

## 2015-07-28 DIAGNOSIS — Z791 Long term (current) use of non-steroidal anti-inflammatories (NSAID): Secondary | ICD-10-CM | POA: Insufficient documentation

## 2015-07-28 DIAGNOSIS — S2242XA Multiple fractures of ribs, left side, initial encounter for closed fracture: Secondary | ICD-10-CM | POA: Insufficient documentation

## 2015-07-28 DIAGNOSIS — Z9889 Other specified postprocedural states: Secondary | ICD-10-CM | POA: Insufficient documentation

## 2015-07-28 DIAGNOSIS — I10 Essential (primary) hypertension: Secondary | ICD-10-CM | POA: Diagnosis not present

## 2015-07-28 DIAGNOSIS — M1612 Unilateral primary osteoarthritis, left hip: Secondary | ICD-10-CM | POA: Insufficient documentation

## 2015-07-28 DIAGNOSIS — E785 Hyperlipidemia, unspecified: Secondary | ICD-10-CM | POA: Diagnosis not present

## 2015-07-28 DIAGNOSIS — Z66 Do not resuscitate: Secondary | ICD-10-CM | POA: Insufficient documentation

## 2015-07-28 DIAGNOSIS — G92 Toxic encephalopathy: Principal | ICD-10-CM | POA: Insufficient documentation

## 2015-07-28 DIAGNOSIS — G309 Alzheimer's disease, unspecified: Secondary | ICD-10-CM | POA: Insufficient documentation

## 2015-07-28 DIAGNOSIS — K219 Gastro-esophageal reflux disease without esophagitis: Secondary | ICD-10-CM | POA: Diagnosis not present

## 2015-07-28 DIAGNOSIS — R296 Repeated falls: Secondary | ICD-10-CM | POA: Insufficient documentation

## 2015-07-28 DIAGNOSIS — R4182 Altered mental status, unspecified: Secondary | ICD-10-CM

## 2015-07-28 DIAGNOSIS — E039 Hypothyroidism, unspecified: Secondary | ICD-10-CM | POA: Insufficient documentation

## 2015-07-28 DIAGNOSIS — Z90710 Acquired absence of both cervix and uterus: Secondary | ICD-10-CM | POA: Insufficient documentation

## 2015-07-28 DIAGNOSIS — I672 Cerebral atherosclerosis: Secondary | ICD-10-CM | POA: Diagnosis not present

## 2015-07-28 DIAGNOSIS — R5383 Other fatigue: Secondary | ICD-10-CM | POA: Diagnosis present

## 2015-07-28 HISTORY — DX: Gastro-esophageal reflux disease without esophagitis: K21.9

## 2015-07-28 LAB — URINALYSIS COMPLETE WITH MICROSCOPIC (ARMC ONLY)
Bilirubin Urine: NEGATIVE
Glucose, UA: 50 mg/dL — AB
KETONES UR: NEGATIVE mg/dL
NITRITE: NEGATIVE
PROTEIN: NEGATIVE mg/dL
Specific Gravity, Urine: 1.026 (ref 1.005–1.030)
pH: 5 (ref 5.0–8.0)

## 2015-07-28 LAB — COMPREHENSIVE METABOLIC PANEL
ALBUMIN: 3.9 g/dL (ref 3.5–5.0)
ALK PHOS: 77 U/L (ref 38–126)
ALT: 40 U/L (ref 14–54)
AST: 42 U/L — AB (ref 15–41)
Anion gap: 10 (ref 5–15)
BILIRUBIN TOTAL: 0.9 mg/dL (ref 0.3–1.2)
BUN: 29 mg/dL — AB (ref 6–20)
CALCIUM: 9.3 mg/dL (ref 8.9–10.3)
CO2: 24 mmol/L (ref 22–32)
CREATININE: 1.08 mg/dL — AB (ref 0.44–1.00)
Chloride: 104 mmol/L (ref 101–111)
GFR calc Af Amer: 53 mL/min — ABNORMAL LOW (ref >60)
GFR calc non Af Amer: 46 mL/min — ABNORMAL LOW (ref >60)
GLUCOSE: 114 mg/dL — AB (ref 65–99)
Potassium: 3.8 mmol/L (ref 3.5–5.1)
Sodium: 138 mmol/L (ref 135–145)
TOTAL PROTEIN: 7.4 g/dL (ref 6.5–8.1)

## 2015-07-28 LAB — CBC
HEMATOCRIT: 40.9 % (ref 35.0–47.0)
Hemoglobin: 13.6 g/dL (ref 12.0–16.0)
MCH: 30.6 pg (ref 26.0–34.0)
MCHC: 33.2 g/dL (ref 32.0–36.0)
MCV: 92.2 fL (ref 80.0–100.0)
PLATELETS: 198 10*3/uL (ref 150–440)
RBC: 4.44 MIL/uL (ref 3.80–5.20)
RDW: 12.7 % (ref 11.5–14.5)
WBC: 9.8 10*3/uL (ref 3.6–11.0)

## 2015-07-28 MED ORDER — BISACODYL 5 MG PO TBEC
5.0000 mg | DELAYED_RELEASE_TABLET | Freq: Every day | ORAL | Status: DC | PRN
  Administered 2015-07-29 – 2015-07-30 (×2): 5 mg via ORAL
  Filled 2015-07-28 (×3): qty 1

## 2015-07-28 MED ORDER — DIVALPROEX SODIUM 125 MG PO CSDR
250.0000 mg | DELAYED_RELEASE_CAPSULE | Freq: Two times a day (BID) | ORAL | Status: DC
  Administered 2015-07-29 – 2015-07-31 (×5): 250 mg via ORAL
  Filled 2015-07-28 (×6): qty 2

## 2015-07-28 MED ORDER — MEMANTINE HCL 5 MG PO TABS
10.0000 mg | ORAL_TABLET | Freq: Two times a day (BID) | ORAL | Status: DC
  Administered 2015-07-28 – 2015-07-31 (×6): 10 mg via ORAL
  Filled 2015-07-28 (×6): qty 2

## 2015-07-28 MED ORDER — SERTRALINE HCL 50 MG PO TABS
75.0000 mg | ORAL_TABLET | Freq: Every day | ORAL | Status: DC
  Administered 2015-07-29 – 2015-07-31 (×3): 75 mg via ORAL
  Filled 2015-07-28: qty 2
  Filled 2015-07-28 (×2): qty 1
  Filled 2015-07-28: qty 2

## 2015-07-28 MED ORDER — QUETIAPINE FUMARATE 25 MG PO TABS
25.0000 mg | ORAL_TABLET | Freq: Two times a day (BID) | ORAL | Status: DC
  Administered 2015-07-28 – 2015-07-31 (×6): 25 mg via ORAL
  Filled 2015-07-28 (×6): qty 1

## 2015-07-28 MED ORDER — POTASSIUM CHLORIDE CRYS ER 20 MEQ PO TBCR
20.0000 meq | EXTENDED_RELEASE_TABLET | Freq: Every day | ORAL | Status: DC
  Administered 2015-07-29 – 2015-07-31 (×3): 20 meq via ORAL
  Filled 2015-07-28 (×3): qty 1

## 2015-07-28 MED ORDER — VITAMIN B-12 1000 MCG PO TABS
500.0000 ug | ORAL_TABLET | Freq: Every day | ORAL | Status: DC
  Administered 2015-07-29 – 2015-07-31 (×3): 500 ug via ORAL
  Filled 2015-07-28 (×3): qty 1

## 2015-07-28 MED ORDER — LORAZEPAM 0.5 MG PO TABS
0.5000 mg | ORAL_TABLET | Freq: Two times a day (BID) | ORAL | Status: DC | PRN
  Administered 2015-07-28 – 2015-07-30 (×3): 0.5 mg via ORAL
  Filled 2015-07-28 (×3): qty 1

## 2015-07-28 MED ORDER — ONDANSETRON HCL 4 MG PO TABS: 4.0000 mg | ORAL_TABLET | Freq: Four times a day (QID) | ORAL | Status: DC | PRN

## 2015-07-28 MED ORDER — DONEPEZIL HCL 5 MG PO TABS
10.0000 mg | ORAL_TABLET | Freq: Every day | ORAL | Status: DC
  Administered 2015-07-28 – 2015-07-30 (×3): 10 mg via ORAL
  Filled 2015-07-28 (×3): qty 2

## 2015-07-28 MED ORDER — ONDANSETRON HCL 4 MG/2ML IJ SOLN: 4.0000 mg | Freq: Four times a day (QID) | INTRAMUSCULAR | Status: DC | PRN

## 2015-07-28 MED ORDER — LEVOTHYROXINE SODIUM 75 MCG PO TABS
75.0000 ug | ORAL_TABLET | Freq: Every day | ORAL | Status: DC
  Administered 2015-07-29 – 2015-07-31 (×3): 75 ug via ORAL
  Filled 2015-07-28 (×3): qty 1

## 2015-07-28 MED ORDER — HEPARIN SODIUM (PORCINE) 5000 UNIT/ML IJ SOLN
5000.0000 [IU] | Freq: Three times a day (TID) | INTRAMUSCULAR | Status: DC
  Administered 2015-07-29 – 2015-07-30 (×3): 5000 [IU] via SUBCUTANEOUS
  Filled 2015-07-28 (×3): qty 1

## 2015-07-28 MED ORDER — FERROUS SULFATE 325 (65 FE) MG PO TABS
325.0000 mg | ORAL_TABLET | Freq: Every day | ORAL | Status: DC
  Administered 2015-07-29 – 2015-07-31 (×3): 325 mg via ORAL
  Filled 2015-07-28 (×3): qty 1

## 2015-07-28 MED ORDER — ASPIRIN EC 81 MG PO TBEC
81.0000 mg | DELAYED_RELEASE_TABLET | Freq: Every day | ORAL | Status: DC
  Administered 2015-07-29 – 2015-07-31 (×3): 81 mg via ORAL
  Filled 2015-07-28 (×3): qty 1

## 2015-07-28 MED ORDER — ACETAMINOPHEN 325 MG PO TABS
650.0000 mg | ORAL_TABLET | Freq: Four times a day (QID) | ORAL | Status: DC | PRN
  Administered 2015-07-29 – 2015-07-30 (×2): 650 mg via ORAL
  Filled 2015-07-28 (×2): qty 2

## 2015-07-28 MED ORDER — DOCUSATE SODIUM 100 MG PO CAPS
100.0000 mg | ORAL_CAPSULE | Freq: Two times a day (BID) | ORAL | Status: DC
  Administered 2015-07-29 – 2015-07-31 (×5): 100 mg via ORAL
  Filled 2015-07-28 (×5): qty 1

## 2015-07-28 MED ORDER — ACETAMINOPHEN 650 MG RE SUPP: 650.0000 mg | Freq: Four times a day (QID) | RECTAL | Status: DC | PRN

## 2015-07-28 MED ORDER — SIMVASTATIN 20 MG PO TABS
20.0000 mg | ORAL_TABLET | Freq: Every day | ORAL | Status: DC
  Administered 2015-07-29 – 2015-07-30 (×2): 20 mg via ORAL
  Filled 2015-07-28 (×2): qty 1

## 2015-07-28 MED ORDER — MELOXICAM 7.5 MG PO TABS
7.5000 mg | ORAL_TABLET | Freq: Every day | ORAL | Status: DC
  Administered 2015-07-29 – 2015-07-31 (×3): 7.5 mg via ORAL
  Filled 2015-07-28 (×3): qty 1

## 2015-07-28 MED ORDER — PANTOPRAZOLE SODIUM 40 MG PO TBEC
40.0000 mg | DELAYED_RELEASE_TABLET | Freq: Every day | ORAL | Status: DC
  Administered 2015-07-29 – 2015-07-31 (×3): 40 mg via ORAL
  Filled 2015-07-28 (×3): qty 1

## 2015-07-28 MED ORDER — SODIUM CHLORIDE 0.9 % IV SOLN
INTRAVENOUS | Status: DC
  Administered 2015-07-28 – 2015-07-30 (×3): via INTRAVENOUS

## 2015-07-28 NOTE — ED Notes (Signed)
Pt assisted onto bedpan and able to urinate.

## 2015-07-28 NOTE — H&P (Signed)
[facility] - [place] at [facility]   PATIENT NAME: Alicia [name]    MR#:  [id number]  DATE OF BIRTH:  04-13-1930  DATE OF ADMISSION:  07/28/2015  PRIMARY CARE PHYSICIAN: Barbette Reichmann, MD   REQUESTING/REFERRING PHYSICIAN: Dr. Minna Antis  CHIEF COMPLAINT: Lethargy   Chief Complaint  Patient presents with  . Shoulder Pain  . Altered Mental Status    HISTORY OF PRESENT ILLNESS:  Alicia [name]  is a 82 y.o. female with a known history of Severe DEMENTIA, essential hypertension, hyperlipidemia sent in from nursing home because of lethargy. Discharged to home  Place  on June 14 within Norco. Last dose of narco was at 6 am this morning. Patient is irritable and not go 5 x 3 25 one tablet every 6 hours at the time of discharge 2 days ago. Since today morning better lethargic, not able to make up associated with global BUN date. According to the son she was normal yesterday and had a good day. Patient also takes Ativan 0.5 mg by mouth twice daily. History of fall and rib fractures on the left side and assumed not recorded in the last discharge. According to the son patient had another  fall yesterday. PAST MEDICAL HISTORY:   Past Medical History  Diagnosis Date  . High cholesterol   . Low blood potassium   . Thyroid disease     hypothyroidism  . Anemia   . Hypertension   . Dementia   . GERD (gastroesophageal reflux disease)     PAST SURGICAL HISTOIRY:   Past Surgical History  Procedure Laterality Date  . Abdominal hysterectomy    . Shoulder surgery Right     SOCIAL HISTORY:   Social History  Substance Use Topics  . Smoking status: Never Smoker   . Smokeless tobacco: Not on file  . Alcohol Use: No    FAMILY HISTORY:   Family History  Problem Relation Age of Onset  . Breast cancer Sister 89    DRUG ALLERGIES:  No Known Allergies  REVIEW OF SYSTEMS:  CONSTITUTIONAL: Unable to obtain review of systems secondary to her  dementia.  MEDICATIONS AT HOME:   Prior to Admission medications   Medication Sig Start Date End Date Taking? Authorizing Provider  aspirin EC 81 MG tablet Take 81 mg by mouth daily.   Yes Historical Provider, MD  cyanocobalamin 500 MCG tablet Take 500 mcg by mouth daily.   Yes Historical Provider, MD  divalproex (DEPAKOTE SPRINKLE) 125 MG capsule Take 250 mg by mouth 2 (two) times daily.   Yes Historical Provider, MD  donepezil (ARICEPT) 10 MG tablet Take 10 mg by mouth at bedtime.    Yes Historical Provider, MD  ferrous sulfate 325 (65 FE) MG EC tablet Take 325 mg by mouth daily with breakfast.    Yes Historical Provider, MD  HYDROcodone-acetaminophen (NORCO) 5-325 MG tablet Take 1 tablet by mouth every 6 (six) hours as needed for moderate pain. 07/26/15  Yes Katha Hamming, MD  levothyroxine (SYNTHROID, LEVOTHROID) 75 MCG tablet Take 75 mcg by mouth daily before breakfast.    Yes Historical Provider, MD  LORazepam (ATIVAN) 0.5 MG tablet Take 1 tablet (0.5 mg total) by mouth 2 (two) times daily as needed for anxiety (and/or agitation). 07/26/15  Yes Katha Hamming, MD  meloxicam (MOBIC) 7.5 MG tablet Take 7.5 mg by mouth daily.    Yes Historical Provider, MD  memantine (NAMENDA) 10 MG tablet Take 10 mg by mouth 2 (two) times  daily.    Yes Historical Provider, MD  pantoprazole (PROTONIX) 40 MG tablet Take 40 mg by mouth daily before breakfast.    Yes Historical Provider, MD  potassium chloride SA (K-DUR,KLOR-CON) 20 MEQ tablet Take 20 mEq by mouth daily.    Yes Historical Provider, MD  QUEtiapine (SEROQUEL) 25 MG tablet Take 25 mg by mouth 2 (two) times daily.   Yes Historical Provider, MD  sertraline (ZOLOFT) 50 MG tablet Take 75 mg by mouth daily.    Yes Historical Provider, MD  simvastatin (ZOCOR) 20 MG tablet Take 20 mg by mouth at bedtime.    Yes Historical Provider, MD      VITAL SIGNS:  Blood pressure 120/57, pulse 60, temperature 97.5 F (36.4 C), temperature source Oral,  resp. rate 25, height 4\' 9"  (1.448 m), weight 49.896 kg (110 lb), SpO2 93 %.  PHYSICAL EXAMINATION:  GENERAL:  82 y.o.-year-old patient lying in the bed with no acute distress.  EYES: Pupils equal, round, reactive to light and accommodation. No scleral icterus. Extraocular muscles intact.  HEENT: Head atraumatic, normocephalic. Oropharynx and nasopharynx clear. Oral mucosa is dry. NECK:  Supple, no jugular venous distention. No thyroid enlargement, no tenderness.  LUNGS: Normal breath sounds bilaterally, no wheezing, rales,rhonchi or crepitation. No use of accessory muscles of respiration.  CARDIOVASCULAR: S1, S2 normal. No murmurs, rubs, or gallops.  ABDOMEN: Soft, nontender, nondistended. Bowel sounds present. No organomegaly or mass.  EXTREMITIES: No pedal edema, cyanosis, or clubbing.  NEUROLOGIc'unable to do  full neurological exam because of lateral lethargy, dementia. PSYCHIATRIC: The patient is lethargic, demented  SKIN: No obvious rash, lesion, or ulcer.   LABORATORY PANEL:   CBC  Recent Labs Lab 07/28/15 1445  WBC 9.8  HGB 13.6  HCT 40.9  PLT 198   ------------------------------------------------------------------------------------------------------------------  Chemistries   Recent Labs Lab 07/28/15 1445  NA 138  K 3.8  CL 104  CO2 24  GLUCOSE 114*  BUN 29*  CREATININE 1.08*  CALCIUM 9.3  AST 42*  ALT 40  ALKPHOS 77  BILITOT 0.9   ------------------------------------------------------------------------------------------------------------------  Cardiac Enzymes  Recent Labs Lab 07/25/15 1353  TROPONINI <0.03   ------------------------------------------------------------------------------------------------------------------  RADIOLOGY:  Dg Chest 1 View  07/28/2015  CLINICAL DATA:  Multiple recent falls. EXAM: CHEST 1 VIEW COMPARISON:  Chest radiograph 07/24/2015 FINDINGS: Cardiomediastinal silhouette is normal. Mediastinal contours appear intact.  There is no evidence of focal airspace consolidation, pleural effusion or pneumothorax. Increased interstitial markings are likely artifactual due to low lung volumes. Osseous structures demonstrates several left-sided lateral mildly displaced rib fractures, mainly left lateral second, third, fourth, and fifth ribs. Soft tissues are grossly normal. IMPRESSION: Low lung volumes. Several left-sided mildly displaced rib fractures. Electronically Signed   By: Ted Mcalpineobrinka  Dimitrova M.D.   On: 07/28/2015 16:08   Dg Pelvis 1-2 Views  07/28/2015  CLINICAL DATA:  History of fall 07/27/2015. Pelvic pain. Initial encounter. EXAM: PELVIS - 1-2 VIEW COMPARISON:  Single-view of the pelvis 07/24/2015. FINDINGS: No acute bony or joint abnormality is identified. Advanced left hip osteoarthritis again seen. No focal bony lesion is identified. IMPRESSION: No acute abnormality. Electronically Signed   By: Drusilla Kannerhomas  Dalessio M.D.   On: 07/28/2015 16:04   Ct Head Wo Contrast  07/28/2015  CLINICAL DATA:  Altered mental status.  Fall 4 days prior EXAM: CT HEAD WITHOUT CONTRAST TECHNIQUE: Contiguous axial images were obtained from the base of the skull through the vertex without intravenous contrast. COMPARISON:  July 24, 2015 FINDINGS: There is  mild diffuse atrophy, stable. There is no intracranial mass effect, hemorrhage, extra-axial fluid collection, or midline shift. Patchy small vessel disease in the centra semiovale bilaterally is stable. Small vessel disease in the internal and external capsules bilaterally are likewise stable. There is no new gray-white compartment lesion. No acute infarct is evident. The bony calvarium appears intact. Small benign enostoses on the right are stable. It is possible that the larger apparent enostosis actually represents a small calcified meningioma without mass effect or edema. This finding has no clinical significance peer Mastoid air cells are clear. No intraorbital lesions are evident. There is  advanced arthropathy in both temporomandibular joints, more severe on the left than on the right. IMPRESSION: Stable atrophy with periventricular small vessel disease. No intracranial mass effect or edema, hemorrhage, or extra-axial fluid collection. No acute appearing infarct. Question small right frontal -parietal junction enostosis versus calcified meningioma without surrounding edema. This is a benign lesion. There is advanced arthropathy in both temporomandibular joints, more severe on the left than on the right. There is marked bony overgrowth in the distal left mandibular condyle region. Electronically Signed   By: Bretta Bang III M.D.   On: 07/28/2015 16:00    EKG:   Orders placed or performed during the hospital encounter of 07/28/15  . ED EKG  . ED EKG  . EKG 12-Lead  . EKG 12-Lead  . EKG 12-Lead  . EKG 12-Lead    IMPRESSION AND PLAN:   # 82 year old female patient with lethargy likely due to combination  Of norco and ativan,; hold  narcotics,use tylenol for pain, her blood work looks normal CBC,BMP  is normal except  slight dehydration. CT head unremarkable.  #2 severe Alzheimer's dementia ; is Depakote, Namenda, Aricept.but hold ativan.continue seroquel .  #3 multiple falls with h/o  rib fractures. On left side; use Tylenol and motrin for pain control.avoid narcotics. Patient was discharged  Recently  with home health physical therapy, rolling walker. Patient is  In a memory care unit  D/w son,  #3 hypothyroidism continue Synthroid. #4 hyperlipidemia continue simvastatin. All the records are reviewed and case discussed with ED provider. Management plans discussed with the patient, family and they are in agreement.  CODE STATUS:DNR  TOTAL TIME TAKING CARE OF THIS PATIENT: 55 minutes.    Katha Hamming M.D on 07/28/2015 at 5:49 PM  Between 7am to 6pm - Pager - [phone]  After 6pm go to [email or web address] - password [email or web address]  [facility]   Office  [phone]  CC: Primary care physician; Barbette Reichmann, MD  Note: This dictation was prepared with Dragon dictation along with smaller phrase technology. Any transcriptional errors that result from this process are unintentional.

## 2015-07-28 NOTE — ED Notes (Addendum)
Pt to ED via EMS from [facility] c/o AMS.  Per EMS patient fell on the 12th and seen in ED prescribed Norco on the 14th, last taken 6am this morning, pt has been more lethargic yesterday and today per facility.  EMS states patient also fell yesterday at facility from wheelchair now c/o left shoulder pain.  Pt has hx Alzheimer's, disoriented to time, place, and situation.

## 2015-07-28 NOTE — ED Notes (Signed)
Transporting patient to room 118- room changed per RN Trula Orehristina

## 2015-07-28 NOTE — ED Notes (Signed)
Pt approved to go to 1C 125, this nurse was told by 1C secretary Champ MungoSharika and nurse Trula Orehristina during report that patient will be in room 1C 118.

## 2015-07-28 NOTE — ED Provider Notes (Addendum)
[facility] Emergency Department Provider Note  Time seen: 3:41 PM  I have reviewed the triage vital signs and the nursing notes.   HISTORY  Chief Complaint Shoulder Pain and Altered Mental Status    HPI Alicia [name] is a 82 y.o. female with a past medical history of dementia, hypertension, anemia, hyperlipidemia who presents to the emergency department altered mental status. According to the family the patient fell 4 days ago, suffering a left rib fracture. Patient was prescribed Norco which they filled 2 days ago. They state since the fall the patient has been very somnolent, sleeping throughout the entire day. Will awaken and answer questions, but largely keeps her eyes closed, and falls asleep quickly. They believe she also had another fall yesterday at her nursing home but they are not sure about this. In the bed the patient is sleeping, will awaken to verbal stimuli, does not open her eyes however, will answer yes and no questions, does fall asleep very easily. Patient was sent from a nursing facility for increased somnolence. Patient last received Norco this morning at 6 AM.     Past Medical History  Diagnosis Date  . High cholesterol   . Low blood potassium   . Thyroid disease     hypothyroidism  . Anemia   . Hypertension   . Dementia   . GERD (gastroesophageal reflux disease)     Patient Active Problem List   Diagnosis Date Noted  . Chest pain 07/24/2015  . Dementia 07/24/2015  . Hypothyroidism 07/24/2015  . Fall 07/24/2015  . HTN (hypertension) 07/24/2015    Past Surgical History  Procedure Laterality Date  . Abdominal hysterectomy    . Shoulder surgery Right     Current Outpatient Rx  Name  Route  Sig  Dispense  Refill  . aspirin EC 81 MG tablet   Oral   Take 81 mg by mouth daily.         . cyanocobalamin 500 MCG tablet   Oral   Take 500 mcg by mouth daily.         . divalproex (DEPAKOTE SPRINKLE) 125 MG capsule   Oral    Take 250 mg by mouth 2 (two) times daily.         Marland Kitchen donepezil (ARICEPT) 10 MG tablet   Oral   Take 10 mg by mouth at bedtime.          . ferrous sulfate 325 (65 FE) MG EC tablet   Oral   Take 325 mg by mouth daily with breakfast.          . HYDROcodone-acetaminophen (NORCO) 5-325 MG tablet   Oral   Take 1 tablet by mouth every 6 (six) hours as needed for moderate pain.   30 tablet   0   . levothyroxine (SYNTHROID, LEVOTHROID) 75 MCG tablet   Oral   Take 75 mcg by mouth daily before breakfast.          . LORazepam (ATIVAN) 0.5 MG tablet   Oral   Take 1 tablet (0.5 mg total) by mouth 2 (two) times daily as needed for anxiety (and/or agitation).   30 tablet   0   . meloxicam (MOBIC) 7.5 MG tablet   Oral   Take 7.5 mg by mouth daily.          . memantine (NAMENDA) 10 MG tablet   Oral   Take 10 mg by mouth 2 (two) times daily.          Marland Kitchen  pantoprazole (PROTONIX) 40 MG tablet   Oral   Take 40 mg by mouth daily before breakfast.          . potassium chloride SA (K-DUR,KLOR-CON) 20 MEQ tablet   Oral   Take 20 mEq by mouth daily.          . QUEtiapine (SEROQUEL) 25 MG tablet   Oral   Take 25 mg by mouth 2 (two) times daily.         . sertraline (ZOLOFT) 50 MG tablet   Oral   Take 75 mg by mouth daily.          . simvastatin (ZOCOR) 20 MG tablet   Oral   Take 20 mg by mouth at bedtime.            Allergies Review of patient's allergies indicates no known allergies.  Family History  Problem Relation Age of Onset  . Breast cancer Sister 59    Social History Social History  Substance Use Topics  . Smoking status: Never Smoker   . Smokeless tobacco: None  . Alcohol Use: No    Review of Systems Constitutional: Negative for fever. Cardiovascular: Negative for chest pain. Respiratory: Negative for shortness of breath. Gastrointestinal: Negative for abdominal pain Musculoskeletal: Left chest pain. Neurological: Negative for  headache 10-point ROS otherwise negative, however limited by the patient's dementia.  ____________________________________________   PHYSICAL EXAM:  VITAL SIGNS: ED Triage Vitals  Enc Vitals Group     BP 07/28/15 1419 142/78 mmHg     Pulse Rate 07/28/15 1419 67     Resp 07/28/15 1419 22     Temp 07/28/15 1419 97.5 F (36.4 C)     Temp Source 07/28/15 1419 Oral     SpO2 07/28/15 1419 94 %     Weight 07/28/15 1419 110 lb (49.896 kg)     Height 07/28/15 1419 4\' 9"  (1.448 m)     Head Cir --      Peak Flow --      Pain Score 07/28/15 1420 0     Pain Loc --      Pain Edu? --      Excl. in GC? --     Constitutional: Somnolent, will awaken and answer questions, largely keeps her eyes closed unless specifically asked open them. Eyes: Normal exam ENT   Head: Normocephalic and atraumatic.   Mouth/Throat: Mucous membranes are moist. Cardiovascular: Normal rate, regular rhythm Respiratory: Normal respiratory effort without tachypnea nor retractions. Breath sounds are clear  Gastrointestinal: Soft and nontender. No distention. Musculoskeletal: Mild right pelvis tenderness to palpation along the right hip. Good range of motion in the hip without any tenderness or pain. Neurologic:  Normal speech and language. No gross focal neurologic deficits. Able to move all extremities well. Skin:  Skin is warm, dry and intact.   ____________________________________________    EKG  EKG reviewed and interpreted by myself shows normal sinus rhythm at 60 bpm, narrow QRS, normal axis, normal intervals, nonspecific without concerning ST changes, overall reassuring EKG.  ____________________________________________    RADIOLOGY  X-rays show no acute abnormality, rib fractures previously identified. CT head  Labs are largely within normal limits.   ____________________________________________    INITIAL IMPRESSION / ASSESSMENT AND PLAN / ED COURSE  Pertinent labs & imaging results that  were available during my care of the patient were reviewed by me and considered in my medical decision making (see chart for details).  The patient presents to the emergency  department for altered mental status/increased somnolence over the past 3-4 days. Patient was started on Norco several days ago although the family believes the somnolence started before the Norco. We will repeat a head CT in the emergency department to rule out a delayed bleed. We will check labs including blood work and urinalysis. EKG is reassuring. Patient does appear somnolent in the emergency department, but awakens with minimal verbal stimuli, answers questions fairly appropriately. Does not appear overly confused per family, but they state she has more somnolent than normal. The patient does not walk without assistance at baseline.   Imaging is largely within normal limits. Labs are largely within normal limits. Patient remains extremely somnolent. She will wake up again to verbal stimuli but falls back asleep within 30 seconds. Hopefully this is medication induced however as the patient remained somnolent we will admit to the hospital for further monitoring and workup if necessary.     ____________________________________________   FINAL CLINICAL IMPRESSION(S) / ED DIAGNOSES  Fall  Somnolence Altered mental status  Minna AntisKevin Katrinna Travieso, MD 07/28/15 1719  Minna AntisKevin Nariyah Osias, MD 07/28/15 1719

## 2015-07-28 NOTE — ED Notes (Signed)
Pt assisted onto bedpan and able to urinate. 

## 2015-07-29 DIAGNOSIS — G92 Toxic encephalopathy: Secondary | ICD-10-CM | POA: Diagnosis not present

## 2015-07-29 LAB — CBC
HEMATOCRIT: 37 % (ref 35.0–47.0)
Hemoglobin: 12.7 g/dL (ref 12.0–16.0)
MCH: 30.6 pg (ref 26.0–34.0)
MCHC: 34.4 g/dL (ref 32.0–36.0)
MCV: 89.1 fL (ref 80.0–100.0)
PLATELETS: 189 10*3/uL (ref 150–440)
RBC: 4.15 MIL/uL (ref 3.80–5.20)
RDW: 12.2 % (ref 11.5–14.5)
WBC: 9.3 10*3/uL (ref 3.6–11.0)

## 2015-07-29 LAB — BASIC METABOLIC PANEL
Anion gap: 6 (ref 5–15)
BUN: 19 mg/dL (ref 6–20)
CO2: 24 mmol/L (ref 22–32)
CREATININE: 0.67 mg/dL (ref 0.44–1.00)
Calcium: 8.6 mg/dL — ABNORMAL LOW (ref 8.9–10.3)
Chloride: 108 mmol/L (ref 101–111)
GFR calc Af Amer: >60 mL/min (ref >60)
Glucose, Bld: 99 mg/dL (ref 65–99)
POTASSIUM: 3.7 mmol/L (ref 3.5–5.1)
SODIUM: 138 mmol/L (ref 135–145)

## 2015-07-29 LAB — GLUCOSE, CAPILLARY: GLUCOSE-CAPILLARY: 88 mg/dL (ref 65–99)

## 2015-07-29 NOTE — Plan of Care (Signed)
Problem: Education: Goal: Knowledge of [facility] Education information/materials will improve Outcome: Not Progressing Pt confused   

## 2015-07-29 NOTE — NC FL2 (Signed)
[place] MEDICAID FL2 LEVEL OF CARE SCREENING TOOL     IDENTIFICATION  Patient Name: Alicia LeachWilma B [name] Birthdate: 03/12/1930 Sex: female Admission Date (Current Location): 07/28/2015  [facility] [place]   Facility and Address:  [facility], [address], [place], [place] [address]      Provider Number: [id number]  Attending Physician Name and Address:  Katha HammingSnehalatha Konidena, MD  Relative Name and Phone Number:       Current Level of Care: Hospital Recommended Level of Care: Assisted Living Facility, Memory Care Prior Approval Number:    Date Approved/Denied:   PASRR Number:    Discharge Plan: Domiciliary (Rest home) (ALF/ Memory Care )    Current Diagnoses: Primary: Dementia  Patient Active Problem List   Diagnosis Date Noted  . Lethargy 07/28/2015  . Chest pain 07/24/2015  . Dementia 07/24/2015  . Hypothyroidism 07/24/2015  . Fall 07/24/2015  . HTN (hypertension) 07/24/2015    Orientation RESPIRATION BLADDER Height & Weight     Self  Normal Incontinent Weight: 125 lb (56.7 kg) Height:  4\' 9"  (144.8 cm)  BEHAVIORAL SYMPTOMS/MOOD NEUROLOGICAL BOWEL NUTRITION STATUS     (none ) Continent Diet (Diet: Heart Healthy )  AMBULATORY STATUS COMMUNICATION OF NEEDS Skin   Limited Assist Verbally Normal                       Personal Care Assistance Level of Assistance  Bathing, Feeding, Dressing Bathing Assistance: Limited assistance Feeding assistance: Limited assistance Dressing Assistance: Limited assistance     Functional Limitations Info  Sight, Hearing, Speech Sight Info: Adequate Hearing Info: Adequate Speech Info: Adequate    SPECIAL CARE FACTORS FREQUENCY                       Contractures      Additional Factors Info  Code Status, Allergies Code Status Info:  (DNR ) Allergies Info:  (No Known Allergies )           Current Medications (07/29/2015):  This is the current hospital active  medication list Current Facility-Administered Medications  Medication Dose Route Frequency Provider Last Rate Last Dose  . 0.9 %  sodium chloride infusion   Intravenous Continuous Katha HammingSnehalatha Konidena, MD 50 mL/hr at 07/28/15 2015    . acetaminophen (TYLENOL) tablet 650 mg  650 mg Oral Q6H PRN Katha HammingSnehalatha Konidena, MD       Or  . acetaminophen (TYLENOL) suppository 650 mg  650 mg Rectal Q6H PRN Katha HammingSnehalatha Konidena, MD      . aspirin EC tablet 81 mg  81 mg Oral Daily Katha HammingSnehalatha Konidena, MD   81 mg at 07/29/15 1004  . bisacodyl (DULCOLAX) EC tablet 5 mg  5 mg Oral Daily PRN Katha HammingSnehalatha Konidena, MD   5 mg at 07/29/15 1003  . cyanocobalamin tablet 500 mcg  500 mcg Oral Daily Katha HammingSnehalatha Konidena, MD   500 mcg at 07/29/15 1003  . divalproex (DEPAKOTE SPRINKLE) capsule 250 mg  250 mg Oral BID Katha HammingSnehalatha Konidena, MD   250 mg at 07/29/15 1003  . docusate sodium (COLACE) capsule 100 mg  100 mg Oral BID Katha HammingSnehalatha Konidena, MD   100 mg at 07/29/15 1003  . donepezil (ARICEPT) tablet 10 mg  10 mg Oral QHS Katha HammingSnehalatha Konidena, MD   10 mg at 07/28/15 2227  . ferrous sulfate tablet 325 mg  325 mg Oral Q breakfast Katha HammingSnehalatha Konidena, MD   325 mg at  07/29/15 1003  . heparin injection 5,000 Units  5,000 Units Subcutaneous Q8H Katha Hamming, MD   5,000 Units at 07/28/15 2133  . levothyroxine (SYNTHROID, LEVOTHROID) tablet 75 mcg  75 mcg Oral QAC breakfast Katha Hamming, MD   75 mcg at 07/29/15 1004  . LORazepam (ATIVAN) tablet 0.5 mg  0.5 mg Oral BID PRN Katha Hamming, MD   0.5 mg at 07/28/15 2228  . meloxicam (MOBIC) tablet 7.5 mg  7.5 mg Oral Daily Katha Hamming, MD   7.5 mg at 07/29/15 1003  . memantine (NAMENDA) tablet 10 mg  10 mg Oral BID Katha Hamming, MD   10 mg at 07/29/15 1003  . ondansetron (ZOFRAN) tablet 4 mg  4 mg Oral Q6H PRN Katha Hamming, MD       Or  . ondansetron (ZOFRAN) injection 4 mg  4 mg Intravenous Q6H PRN Katha Hamming, MD      . pantoprazole  (PROTONIX) EC tablet 40 mg  40 mg Oral QAC breakfast Katha Hamming, MD   40 mg at 07/29/15 1003  . potassium chloride SA (K-DUR,KLOR-CON) CR tablet 20 mEq  20 mEq Oral Daily Katha Hamming, MD   20 mEq at 07/29/15 1003  . QUEtiapine (SEROQUEL) tablet 25 mg  25 mg Oral BID Katha Hamming, MD   25 mg at 07/29/15 1004  . sertraline (ZOLOFT) tablet 75 mg  75 mg Oral Daily Katha Hamming, MD   75 mg at 07/29/15 1003  . simvastatin (ZOCOR) tablet 20 mg  20 mg Oral QHS Katha Hamming, MD   20 mg at 07/28/15 2126     Discharge Medications: Please see discharge summary for a list of discharge medications.  Relevant Imaging Results:  Relevant Lab Results:   Additional Information  (SSN: [id number])  Haig Prophet, LCSW

## 2015-07-29 NOTE — Progress Notes (Signed)
[facility] [place] at [facility]   PATIENT NAME: Alicia SpragueWilma [name]    MR#:  [id number]  DATE OF BIRTH:  08/30/1930  SUBJECTIVEmore alert,than admission.opens eyes to calling her name.son is requesting ome more day observation for her lethargy.  CHIEF COMPLAINT:   Chief Complaint  Patient presents with  . Shoulder Pain  . Altered Mental Status    REVIEW OF SYSTEMS:   Review of Systems  Unable to perform ROS: dementia  , weakness.   No Known Allergies  VITALS:  Blood pressure 155/56, pulse 69, temperature 98.7 F (37.1 C), temperature source Oral, resp. rate 18, height 4\' 9"  (1.448 m), weight 56.7 kg (125 lb), SpO2 95 %.  PHYSICAL EXAMINATION:  GENERAL:  82 y.o.-year-old patient lying in the bed with no acute distress.  EYES: Pupils equal, round, reactive to light and accommodation. No scleral icterus. Extraocular muscles intact.  HEENT: Head atraumatic, normocephalic. Oropharynx and nasopharynx clear.  NECK:  Supple, no jugular venous distention. No thyroid enlargement, no tenderness.  LUNGS: Normal breath sounds bilaterally, no wheezing, rales,rhonchi or crepitation. No use of accessory muscles of respiration.  CARDIOVASCULAR: S1, S2 normal. No murmurs, rubs, or gallops.  ABDOMEN: Soft, nontender, nondistended. Bowel sounds present. No organomegaly or mass.  EXTREMITIES: No pedal edema, cyanosis, or clubbing.  NEUROLOGIC:   Not able to  Do full neurological  Exam  Due to dementeia. PSYCHIATRIC: The patient is awake but demented. SKIN: No obvious rash, lesion, or ulcer.    LABORATORY PANEL:   CBC  Recent Labs Lab 07/29/15 0531  WBC 9.3  HGB 12.7  HCT 37.0  PLT 189   ------------------------------------------------------------------------------------------------------------------  Chemistries   Recent Labs Lab 07/28/15 1445 07/29/15 0531  NA 138 138  K 3.8 3.7  CL 104 108  CO2 24 24  GLUCOSE 114* 99  BUN 29* 19  CREATININE 1.08*  0.67  CALCIUM 9.3 8.6*  AST 42*  --   ALT 40  --   ALKPHOS 77  --   BILITOT 0.9  --    ------------------------------------------------------------------------------------------------------------------  Cardiac Enzymes  Recent Labs Lab 07/25/15 1353  TROPONINI <0.03   ------------------------------------------------------------------------------------------------------------------  RADIOLOGY:  Dg Chest 1 View  07/28/2015  CLINICAL DATA:  Multiple recent falls. EXAM: CHEST 1 VIEW COMPARISON:  Chest radiograph 07/24/2015 FINDINGS: Cardiomediastinal silhouette is normal. Mediastinal contours appear intact. There is no evidence of focal airspace consolidation, pleural effusion or pneumothorax. Increased interstitial markings are likely artifactual due to low lung volumes. Osseous structures demonstrates several left-sided lateral mildly displaced rib fractures, mainly left lateral second, third, fourth, and fifth ribs. Soft tissues are grossly normal. IMPRESSION: Low lung volumes. Several left-sided mildly displaced rib fractures. Electronically Signed   By: Ted Mcalpineobrinka  Dimitrova M.D.   On: 07/28/2015 16:08   Dg Pelvis 1-2 Views  07/28/2015  CLINICAL DATA:  History of fall 07/27/2015. Pelvic pain. Initial encounter. EXAM: PELVIS - 1-2 VIEW COMPARISON:  Single-view of the pelvis 07/24/2015. FINDINGS: No acute bony or joint abnormality is identified. Advanced left hip osteoarthritis again seen. No focal bony lesion is identified. IMPRESSION: No acute abnormality. Electronically Signed   By: Drusilla Kannerhomas  Dalessio M.D.   On: 07/28/2015 16:04   Ct Head Wo Contrast  07/28/2015  CLINICAL DATA:  Altered mental status.  Fall 4 days prior EXAM: CT HEAD WITHOUT CONTRAST TECHNIQUE: Contiguous axial images were obtained from the base of the skull through the vertex without intravenous contrast. COMPARISON:  July 24, 2015 FINDINGS: There is mild diffuse  atrophy, stable. There is no intracranial mass effect,  hemorrhage, extra-axial fluid collection, or midline shift. Patchy small vessel disease in the centra semiovale bilaterally is stable. Small vessel disease in the internal and external capsules bilaterally are likewise stable. There is no new gray-white compartment lesion. No acute infarct is evident. The bony calvarium appears intact. Small benign enostoses on the right are stable. It is possible that the larger apparent enostosis actually represents a small calcified meningioma without mass effect or edema. This finding has no clinical significance peer Mastoid air cells are clear. No intraorbital lesions are evident. There is advanced arthropathy in both temporomandibular joints, more severe on the left than on the right. IMPRESSION: Stable atrophy with periventricular small vessel disease. No intracranial mass effect or edema, hemorrhage, or extra-axial fluid collection. No acute appearing infarct. Question small right frontal -parietal junction enostosis versus calcified meningioma without surrounding edema. This is a benign lesion. There is advanced arthropathy in both temporomandibular joints, more severe on the left than on the right. There is marked bony overgrowth in the distal left mandibular condyle region. Electronically Signed   By: Bretta Bang III M.D.   On: 07/28/2015 16:00    EKG:   Orders placed or performed during the hospital encounter of 07/28/15  . ED EKG  . ED EKG  . EKG 12-Lead  . EKG 12-Lead  . EKG 12-Lead  . EKG 12-Lead    ASSESSMENT AND PLAN:  Lethargy due to narcotics;and ativan;improved after stopping narco 2.recent fall and left rib fracture;continue tylenol or motirin  3.htn;controlled 4.recurrent falls due to advanced  Dementia; 5.hypothyroidsim Advanced alzeimers  Dementia;on depakote,dementia,zoloft,seroquel D/w son Possible d/c am     All the records are reviewed and case discussed with Care Management/Social Workerr. Management plans discussed  with the patient, family and they are in agreement.  CODE STATUS:DNR  TOTAL TIME TAKING CARE OF THIS PATIENT: 35 minutes.   POSSIBLE D/C IN 1-2DAYS, DEPENDING ON CLINICAL CONDITION.   Katha Hamming M.D on 07/29/2015 at 6:03 PM  Between 7am to 6pm - Pager - [phone]  After 6pm go to [email or web address] - password [email or web address]  [facility]  Office  [phone]  CC: Primary care physician; Barbette Reichmann, MD   Note: This dictation was prepared with Dragon dictation along with smaller phrase technology. Any transcriptional errors that result from this process are unintentional.

## 2015-07-29 NOTE — Discharge Summary (Signed)
Alicia [name], is a 82 y.o. female  DOB 1930-06-30  MRN [id number].  Admission date:  07/28/2015  Admitting Physician  Katha Hamming, MD  Discharge Date:  07/29/2015   Primary MD  Barbette Reichmann, MD  Recommendations for primary care physician for things to follow:  Follow-up with primary doctor in 1 week   Admission Diagnosis  Altered mental status, unspecified altered mental status type [R41.82]   Discharge Diagnosis  Altered mental status, unspecified altered mental status type [R41.82]    Active Problems:   Lethargy      Past Medical History  Diagnosis Date  . High cholesterol   . Low blood potassium   . Thyroid disease     hypothyroidism  . Anemia   . Hypertension   . Dementia   . GERD (gastroesophageal reflux disease)     Past Surgical History  Procedure Laterality Date  . Abdominal hysterectomy    . Shoulder surgery Right        History of present illness and  Hospital Course:     Kindly see H&P for history of present illness and admission details, please review complete Labs, Consult reports and Test reports for all details in brief  HPI  from the history and physical done on the day of admission  82 year old female patient admitted for lethargy. Patient was recently [facility], at that time patient had fall, left-sided rib fractures, discharged with the Norco.  but the came back again after 2 days of discharge because of lethargy and poor responsiveness. On admission lab data all WITHIN normal limits so we discontinued the Norco, admitted for observation for lethargy.  Hospital Course  #1 .lethargy, poor responsiveness secondary to polypharmacy from Norco, Ativan: Patient Norco was started on admission. We'll give Tylenol for pain control. [name] morning  patient is a right earache but has baseline dementia. No fever. No hypoxia. Hemodynamically stable. Because of lethargy, some dehydration she received IV hydration. Likely discharge the [name]. I will talk to patient's son. 2.htn;controlled Seizure dementia, behavioral disturbances: Patient is from [facility] unit [facility]. On Depakote, Seroquel, Zoloft, Ativan: Continue all of them. 3.code;DNR 4. And has a history of recurrent falls, recent fall resulted in left sided rib fractures: Discharged to memory care unit with the rolling walker and physical therapy. Patient can now use them but because of her dementia she is at high risk for falls again.  Discharge Condition: stable   Follow UP      Discharge Instructions  and  Discharge Medications        Medication List    STOP taking these medications        HYDROcodone-acetaminophen 5-325 MG tablet  Commonly known as:  NORCO      TAKE these medications        aspirin EC 81 MG tablet  Take 81 mg by mouth daily.     cyanocobalamin 500 MCG tablet  Take 500 mcg by mouth daily.     divalproex 125 MG capsule  Commonly known as:  DEPAKOTE SPRINKLE  Take 250 mg by mouth 2 (two) times daily.     donepezil 10 MG tablet  Commonly known as:  ARICEPT  Take 10 mg by mouth at bedtime.     ferrous sulfate 325 (65 FE) MG EC tablet  Take 325 mg by mouth daily with breakfast.     levothyroxine 75 MCG tablet  Commonly known as:  SYNTHROID, LEVOTHROID  Take 75 mcg by mouth daily before breakfast.     LORazepam 0.5 MG tablet  Commonly known as:  ATIVAN  Take 1 tablet (0.5 mg total) by mouth 2 (two) times daily as needed for anxiety (and/or agitation).     meloxicam 7.5 MG tablet  Commonly known as:  MOBIC  Take 7.5 mg by mouth daily.     memantine 10 MG tablet  Commonly known as:  NAMENDA  Take 10 mg by mouth 2 (two) times daily.     pantoprazole 40 MG tablet  Commonly known as:  PROTONIX  Take 40 mg by mouth daily before  breakfast.     potassium chloride SA 20 MEQ tablet  Commonly known as:  K-DUR,KLOR-CON  Take 20 mEq by mouth daily.     QUEtiapine 25 MG tablet  Commonly known as:  SEROQUEL  Take 25 mg by mouth 2 (two) times daily.     sertraline 50 MG tablet  Commonly known as:  ZOLOFT  Take 75 mg by mouth daily.     simvastatin 20 MG tablet  Commonly known as:  ZOCOR  Take 20 mg by mouth at bedtime.          Diet and Activity recommendation: See Discharge Instructions above   Consults obtained - none   Major procedures and Radiology Reports - PLEASE review detailed and final reports for all details, in brief -     Dg Chest 1 View  07/28/2015  CLINICAL DATA:  Multiple recent falls. EXAM: CHEST 1 VIEW COMPARISON:  Chest radiograph 07/24/2015 FINDINGS: Cardiomediastinal silhouette is normal. Mediastinal contours appear intact. There is no evidence of focal airspace consolidation, pleural effusion or pneumothorax. Increased interstitial markings are likely artifactual due to low lung volumes. Osseous structures demonstrates several left-sided lateral mildly displaced rib fractures, mainly left lateral second, third, fourth, and fifth ribs. Soft tissues are grossly normal. IMPRESSION: Low lung volumes. Several left-sided mildly displaced rib fractures. Electronically Signed   By: Ted Mcalpine M.D.   On: 07/28/2015 16:08   Dg Ribs Unilateral W/chest Left  07/24/2015  CLINICAL DATA:  Left upper/anterior chest wall pain status post unwitnessed fall. EXAM: LEFT RIBS AND CHEST - 3+ VIEW COMPARISON:  Chest radiographs 10/23/2013. Thoracic spine radiographs 04/11/2014. FINDINGS: The bones are mildly demineralized. There are several left lateral rib deformities involving the second through sixth ribs consistent with fractures. Some of these appear healed, while others could be subacute. No displaced rib fracture, pneumothorax or significant pleural effusion identified. The heart size and mediastinal  contours are stable. The lungs are clear. Left upper quadrant abdominal calcification is unchanged, corresponding with a renal calcification on prior CT from 2014. IMPRESSION: Several age indeterminate nondisplaced left-sided rib fractures are present. No evidence of pneumothorax or pleural effusion. Electronically Signed   By: Carey Bullocks M.D.   On: 07/24/2015 17:07   Dg Pelvis 1-2 Views  07/28/2015  CLINICAL DATA:  History of fall 07/27/2015. Pelvic pain. Initial encounter. EXAM: PELVIS - 1-2 VIEW COMPARISON:  Single-view of the pelvis 07/24/2015. FINDINGS: No acute bony or joint abnormality is identified. Advanced left hip osteoarthritis again seen. No focal bony lesion is identified. IMPRESSION: No acute abnormality. Electronically Signed   By: Drusilla Kanner M.D.   On: 07/28/2015 16:04   Dg Pelvis 1-2 Views  07/24/2015  CLINICAL DATA:  82 year old female with unwitnessed fall. Pain. Initial encounter. EXAM: PELVIS - 1-2 VIEW COMPARISON:  Right hip series 01/21/13 FINDINGS: Chronic asymmetric increased sclerosis and osteophytosis  about the left hip appears stable. Both proximal femurs appear grossly stable and intact. Pelvis intact. SI joints appear stable. IMPRESSION: No acute fracture or dislocation identified about the pelvis. If there is lateralizing hip pain, recommend dedicated hip series. Electronically Signed   By: Odessa FlemingH  Hall M.D.   On: 07/24/2015 17:11   Ct Head Wo Contrast  07/28/2015  CLINICAL DATA:  Altered mental status.  Fall 4 days prior EXAM: CT HEAD WITHOUT CONTRAST TECHNIQUE: Contiguous axial images were obtained from the base of the skull through the vertex without intravenous contrast. COMPARISON:  July 24, 2015 FINDINGS: There is mild diffuse atrophy, stable. There is no intracranial mass effect, hemorrhage, extra-axial fluid collection, or midline shift. Patchy small vessel disease in the centra semiovale bilaterally is stable. Small vessel disease in the internal and external  capsules bilaterally are likewise stable. There is no new gray-white compartment lesion. No acute infarct is evident. The bony calvarium appears intact. Small benign enostoses on the right are stable. It is possible that the larger apparent enostosis actually represents a small calcified meningioma without mass effect or edema. This finding has no clinical significance peer Mastoid air cells are clear. No intraorbital lesions are evident. There is advanced arthropathy in both temporomandibular joints, more severe on the left than on the right. IMPRESSION: Stable atrophy with periventricular small vessel disease. No intracranial mass effect or edema, hemorrhage, or extra-axial fluid collection. No acute appearing infarct. Question small right frontal -parietal junction enostosis versus calcified meningioma without surrounding edema. This is a benign lesion. There is advanced arthropathy in both temporomandibular joints, more severe on the left than on the right. There is marked bony overgrowth in the distal left mandibular condyle region. Electronically Signed   By: Bretta BangWilliam  Woodruff III M.D.   On: 07/28/2015 16:00   Ct Head Wo Contrast  07/24/2015  CLINICAL DATA:  Recent fall EXAM: CT HEAD WITHOUT CONTRAST TECHNIQUE: Contiguous axial images were obtained from the base of the skull through the vertex without intravenous contrast. COMPARISON:  10/16/2014 FINDINGS: Bony calvarium is intact. Degenerative changes of the left temporomandibular joint are seen and stable. Diffuse atrophic changes stable from the prior exam. No findings to suggest acute hemorrhage, acute infarction or space-occupying mass lesion are seen. Calcified meningioma is again noted in the right frontoparietal region. IMPRESSION: No acute abnormality noted.  Stable chronic atrophic changes. Electronically Signed   By: Alcide CleverMark  Lukens M.D.   On: 07/24/2015 17:07   Dg Hip Unilat With Pelvis 2-3 Views Left  07/24/2015  CLINICAL DATA:  Fall [name] with  left hip pain, initial encounter EXAM: DG HIP (WITH OR WITHOUT PELVIS) 2-3V LEFT COMPARISON:  10/16/2014 FINDINGS: Degenerative changes of left hip joint are noted. No acute fracture or dislocation is seen. No gross soft tissue abnormality is noted. IMPRESSION: No acute abnormality noted. Electronically Signed   By: Alcide CleverMark  Lukens M.D.   On: 07/24/2015 20:39   Dg Hip Unilat With Pelvis 2-3 Views Right  07/24/2015  CLINICAL DATA:  Left hip pain after fall. EXAM: DG HIP (WITH OR WITHOUT PELVIS) 2-3V RIGHT COMPARISON:  Left hip 07/24/2015 FINDINGS: Right hip is located without a fracture. Right pubic rami are intact. IMPRESSION: No acute abnormality to the right hip. Electronically Signed   By: Richarda OverlieAdam  Henn M.D.   On: 07/24/2015 20:42    Micro Results    Recent Results (from the past 240 hour(s))  MRSA PCR Screening     Status: None   Collection Time:  07/24/15 11:58 PM  Result Value Ref Range Status   MRSA by PCR NEGATIVE NEGATIVE Final    Comment:        The GeneXpert MRSA Assay (FDA approved for NASAL specimens only), is one component of a comprehensive MRSA colonization surveillance program. It is not intended to diagnose MRSA infection nor to guide or monitor treatment for MRSA infections.        [name]   Subjective:   Alicia [name] ,more alert,awake than on admission,back to her baseline,  Objective:   Blood pressure 161/60, pulse 64, temperature 97.9 F (36.6 C), temperature source Oral, resp. rate 20, height  (1.448 m), weight 56.7 kg (125 lb), SpO2 95 %.  No intake or output data in the 24 hours ending 07/29/15 0841  Exam Awake  But has dementia,her baseline, No new F.N deficits, Normal affect [place].AT,PERRAL Supple Neck,No JVD, No cervical lymphadenopathy appriciated.  Symmetrical Chest wall movement, Good air movement bilaterally, CTAB RRR,No Gallops,Rubs or new Murmurs, No Parasternal Heave +ve B.Sounds, Abd Soft, Non tender, No organomegaly appriciated, No  rebound -guarding or rigidity. No Cyanosis, Clubbing or edema, No new Rash or bruise  Data Review   CBC w Diff:  Lab Results  Component Value Date   WBC 9.3 07/29/2015   WBC 8.1 05/04/2014   HGB 12.7 07/29/2015   HGB 11.4* 05/04/2014   HCT 37.0 07/29/2015   HCT 36.2 05/04/2014   PLT 189 07/29/2015   PLT 273 05/04/2014   LYMPHOPCT 17 07/24/2015   LYMPHOPCT 16.1 05/04/2014   MONOPCT 8 07/24/2015   MONOPCT 8.5 05/04/2014   EOSPCT 5 07/24/2015   EOSPCT 3.2 05/04/2014   BASOPCT 0 07/24/2015   BASOPCT 0.6 05/04/2014    CMP:  Lab Results  Component Value Date   NA 138 07/29/2015   NA 138 05/04/2014   K 3.7 07/29/2015   K 2.9* 05/04/2014   CL 108 07/29/2015   CL 105 05/04/2014   CO2 24 07/29/2015   CO2 25 05/04/2014   BUN 19 07/29/2015   BUN 11 05/04/2014   CREATININE 0.67 07/29/2015   CREATININE 1.10* 05/04/2014   PROT 7.4 07/28/2015   PROT 6.9 01/11/2013   ALBUMIN 3.9 07/28/2015   ALBUMIN 3.6 01/11/2013   BILITOT 0.9 07/28/2015   BILITOT 0.2 01/11/2013   ALKPHOS 77 07/28/2015   ALKPHOS 149* 01/11/2013   AST 42* 07/28/2015   AST 26 01/11/2013   ALT 40 07/28/2015   ALT 22 01/11/2013  .   Total Time in preparing paper work, data evaluation and todays exam - 35 minutes  Shekinah Pitones M.D on 07/29/2015 at 8:41 AM    Note: This dictation was prepared with Dragon dictation along with smaller phrase technology. Any transcriptional errors that result from this process are unintentional.                                                                                                                                                                                  .

## 2015-07-29 NOTE — Clinical Social Work Note (Signed)
Clinical Social Work Assessment  Patient Details  Name: Alicia [name] MRN: [id number] Date of Birth: 09/20/1930  Date of referral:  07/29/15               Reason for consult:  Other (Comment Required) (From [facility] )                Permission sought to share information with:  Facility Industrial/product designerContact Representative Permission granted to share information::  Yes, Verbal Permission Granted  Name::      [facility]   Agency::     Relationship::     Contact Information:     Housing/Transportation Living arrangements for the past 2 months:  Single Family Home, Assisted Living Facility Source of Information:  Adult Children Patient Interpreter Needed:  None Criminal Activity/Legal Involvement Pertinent to Current Situation/Hospitalization:  No - Comment as needed Significant Relationships:  Adult Children Lives with:  Facility Resident Do you feel safe going back to the place where you live?  Yes Need for family participation in patient care:  Yes (Comment)  Care giving concerns:  Patient is a resident at [facility] memory care.    Social Worker assessment / plan:  Visual merchandiserClinical Social Worker (CSW) received verbal consult from MD that patient is from [facility]. Per chart patient is not alert and oriented. CSW contacted patient's son Alicia [name]. Per son patient has recently moved into [facility] memory care 3 weeks ago. Per son patient's [name]. Son reported that patient has had several falls and contributes it to her dementia and poor vision. Per son patient [facility] with [facility] however he got her a wheel chair from a medical supply store. Per son patient is not able to use the walker yet and uses the wheel chair to get around. Son reported [facility]. Per son patient is not on medicaid and is paying privately for [facility]. Son is agreeable for patient to  return to [facility]. Son requested EMS for transport back to [facility].   FL2 complete. CSW contacted [facility] and spoke with Med Alicia [name] aware of above. Per Alicia [name] patient can return over the weekend. CSW will continue to follow and assist as needed.    Employment status:  Disabled (Comment on whether or not currently receiving Disability), Retired Database administratornsurance information:  Managed Medicare PT Recommendations:  Not assessed at this time Information / Referral to community resources:  Other (Comment Required) (Patient will return to ALF )  Patient/Family's Response to care:  Patient's son Alicia [name] is agreeable for patient to return to [facility].   Patient/Family's Understanding of and Emotional Response to Diagnosis, Current Treatment, and Prognosis:  Patient's son was pleasant and thanked CSW for calling.   Emotional Assessment Appearance:  Appears stated age Attitude/Demeanor/Rapport:  Unable to Assess Affect (typically observed):  Unable to Assess Orientation:  Oriented to Self, Fluctuating Orientation (Suspected and/or reported Sundowners) Alcohol / Substance use:  Not Applicable Psych involvement (Current and /or in the community):  No (Comment)  Discharge Needs  Concerns to be addressed:  Discharge Planning Concerns Readmission within the last 30 days:  Yes Current discharge risk:  Cognitively Impaired, Dependent with Mobility Barriers to Discharge:  Continued Medical Work up   Alicia [name], Alicia Mcdonnell [name], Alicia [name] 07/29/2015, 10:18 AM

## 2015-07-30 DIAGNOSIS — G92 Toxic encephalopathy: Secondary | ICD-10-CM | POA: Diagnosis not present

## 2015-07-30 LAB — GLUCOSE, CAPILLARY: GLUCOSE-CAPILLARY: 94 mg/dL (ref 65–99)

## 2015-07-30 NOTE — Evaluation (Signed)
Physical Therapy Evaluation Patient Details Name: Alicia [name] MRN: [id number] DOB: [birth year] Today's Date: 07/30/2015   History of Present Illness  82 yo F with recent admission for fall and L rib fractures, and discharged to [facility] 6/14. Pt returned to ED 6/16 for AMS and another fall from her wheelchair. PMH includes severe dementia, HTN, and hypothyroid disease.  Clinical Impression  Pt demonstrated generalized weakness and difficulty walking after multiple hospitalizations recently. She is highly limited by her cognitive status. Pt required mod A for bed mobility. Transfers and ambulation up to 15 ft required FWW and mod A. Pt unsteady during ambulation and impulsive, frequently letting go of FWW. STR is recommended after hospital discharge to address deficits of strength, balance and gait to progress towards PLOF and reduce caregiver burden of care. Pt will benefit from skilled PT services to increase functional I and mobility for safe discharge.     Follow Up Recommendations SNF    Equipment Recommendations  Rolling walker with 5" wheels    Recommendations for Other Services       Precautions / Restrictions Precautions Precautions: Fall Restrictions Weight Bearing Restrictions: No      Mobility  Bed Mobility Overal bed mobility: Needs Assistance Bed Mobility: Sit to Supine;Supine to Sit     Supine to sit: Mod assist Sit to supine: Mod assist   General bed mobility comments: difficulty getting trunk upright  Transfers Overall transfer level: Needs assistance Equipment used: Rolling walker (2 wheeled) Transfers: Sit to/from [employer] Sit to Stand: Mod assist Stand pivot transfers: Mod assist       General transfer comment: pt frequently letting go of FWW, flexed posture, unsteady, difficulty following cues  Ambulation/Gait Ambulation/Gait assistance: Mod assist Ambulation Distance (Feet): 15 Feet Assistive device: Rolling walker (2  wheeled) Gait Pattern/deviations: Step-to pattern;Shuffle;Trunk flexed;Narrow base of support Gait velocity: reduced Gait velocity interpretation: Below normal speed for age/gender General Gait Details: Pt highly unsteady. Frequently letting go of FWW and pushing it way infront of herself.  Stairs            Wheelchair Mobility    Modified Rankin (Stroke Patients Only)       Balance Overall balance assessment: Needs assistance;History of Falls Sitting-balance support: Bilateral upper extremity supported;Feet supported Sitting balance-Leahy Scale: Fair     Standing balance support: Bilateral upper extremity supported Standing balance-Leahy Scale: Poor Standing balance comment: requires min A to maintain balance                             Pertinent Vitals/Pain Pain Assessment: No/denies pain    Home Living Family/patient expects to be discharged to:: Assisted living               Home Equipment: Walker - 2 wheels Additional Comments: Pt moved to [facility] 3 weeks ago.    Prior Function Level of Independence: Needs assistance   Gait / Transfers Assistance Needed: limited ambulation with FWW  ADL's / Homemaking Assistance Needed: assistance from staff        Hand Dominance        Extremity/Trunk Assessment   Upper Extremity Assessment: Generalized weakness           Lower Extremity Assessment: Generalized weakness         Communication   Communication: No difficulties  Cognition Arousal/Alertness: Lethargic Behavior During Therapy: WFL for tasks assessed/performed Overall Cognitive Status: History of cognitive impairments -  at baseline                      General Comments General comments (skin integrity, edema, etc.): confused, session limited by cognition    Exercises Other Exercises Other Exercises: Pt ambulated 15 ft x2 with FWW and mod A. Frequently lets go of FWW, unsteady, flexed posture,  impulsive. Requires constant cues for safety, holding onto FWW and staying [facility].      Assessment/Plan    PT Assessment Patient needs continued PT services  PT Diagnosis Difficulty walking;Generalized weakness   PT Problem List Decreased strength;Decreased range of motion;Decreased activity tolerance;Decreased balance;Decreased mobility;Decreased coordination;Decreased cognition;Decreased knowledge of use of DME;Pain  PT Treatment Interventions DME instruction;Gait training;Functional mobility training;Therapeutic activities;Therapeutic exercise;Balance training;Neuromuscular re-education;Cognitive remediation;Patient/family education   PT Goals (Current goals can be found in the Care Plan section) Acute Rehab PT Goals Patient Stated Goal: unable to state PT Goal Formulation: With patient Time For Goal Achievement: 08/13/15 Potential to Achieve Goals: Fair    Frequency Min 2X/week   Barriers to discharge   none    Co-evaluation               End of Session Equipment Utilized During Treatment: Gait belt Activity Tolerance: Patient limited by lethargy Patient left: in bed;with call bell/phone within reach;with bed alarm set Nurse Communication: Mobility status    Functional Assessment Tool Used: clinical judgement Functional Limitation: Mobility: Walking and moving around Mobility: Walking and Moving Around Current Status [phone](G8978): At least 60 percent but less than 80 percent impaired, limited or restricted Mobility: Walking and Moving Around Goal Status [phone](G8979): At least 20 percent but less than 40 percent impaired, limited or restricted    Time: [id number] PT Time Calculation (min) (ACUTE ONLY): 23 min   Charges:   PT Evaluation $PT Eval High Complexity: 1 Procedure PT Treatments $Gait Training: 8-22 mins   PT G Codes:   PT G-Codes **NOT FOR INPATIENT CLASS** Functional Assessment Tool Used: clinical judgement Functional Limitation: Mobility: Walking and  moving around Mobility: Walking and Moving Around Current Status ([phone]): At least 60 percent but less than 80 percent impaired, limited or restricted Mobility: Walking and Moving Around Goal Status [phone](G8979): At least 20 percent but less than 40 percent impaired, limited or restricted    Adelene IdlerMindy Jo Disney Ruggiero, PT, DPT  07/30/2015, 2:21 PM [phone]

## 2015-07-30 NOTE — Clinical Social Work Placement (Signed)
   CLINICAL SOCIAL WORK PLACEMENT  NOTE  Date:  07/30/2015  Patient Details  Name: Alicia [name] MRN: [id number] Date of Birth: 02/12/1930  Clinical Social Work is seeking post-discharge placement for this patient at the Skilled  Nursing Facility level of care (*CSW will initial, date and re-position this form in  chart as items are completed):  Yes   Patient/family provided with [place] Clinical Social Work Department's list of facilities offering this level of care within the geographic area requested by the patient (or if unable, by the patient's family).  Yes   Patient/family informed of their freedom to choose among providers that offer the needed level of care, that participate in Medicare, Medicaid or managed care program needed by the patient, have an available bed and are willing to accept the patient.  Yes   Patient/family informed of [facility]'s ownership interest in [facility] and [facility], as well as of the fact that they are under no obligation to receive care at these facilities.  PASRR submitted to EDS on 07/30/15     PASRR number received on 07/30/15     Existing PASRR number confirmed on       FL2 transmitted to all facilities in geographic area requested by pt/family on 07/30/15     FL2 transmitted to all facilities within larger geographic area on       Patient informed that his/her managed care company has contracts with or will negotiate with certain facilities, including the following:            Patient/family informed of bed offers received.  Patient chooses bed at       Physician recommends and patient chooses bed at      Patient to be transferred to   on  .  Patient to be transferred to facility by       Patient family notified on   of transfer.  Name of family member notified:        PHYSICIAN       Additional Comment:    _______________________________________________ Haig ProphetMorgan, Lynee Rosenbach G, LCSW 07/30/2015, 10:12 AM

## 2015-07-30 NOTE — NC FL2 (Signed)
[place] MEDICAID FL2 LEVEL OF CARE SCREENING TOOL     IDENTIFICATION  Patient Name: Alicia LeachWilma B Palmateer Birthdate: 10/25/1930 Sex: female Admission Date (Current Location): 07/28/2015  [place]   Facility and Address:  [facility], [address], [place], [place] [address]      Provider Number: [id number]  Attending Physician Name and Address:  Katha HammingSnehalatha Konidena, MD  Relative Name and Phone Number:       Current Level of Care: Hospital Recommended Level of Care: SNF  Prior Approval Number:    Date Approved/Denied:   PASRR Number:   [id number]   Discharge Plan: SNF    Current Diagnoses: Patient Active Problem List   Diagnosis Date Noted  . Lethargy 07/28/2015  . Chest pain 07/24/2015  . Dementia 07/24/2015  . Hypothyroidism 07/24/2015  . Fall 07/24/2015  . HTN (hypertension) 07/24/2015    Orientation RESPIRATION BLADDER Height & Weight     Self  Normal Incontinent Weight: 126 lb 1.6 oz (57.199 kg) Height:  4\' 9"  (144.8 cm)  BEHAVIORAL SYMPTOMS/MOOD NEUROLOGICAL BOWEL NUTRITION STATUS     (none ) Continent Diet (Diet: Heart Healthy )  AMBULATORY STATUS COMMUNICATION OF NEEDS Skin   Extensive Assist  Verbally Normal                       Personal Care Assistance Level of Assistance  Bathing, Feeding, Dressing Bathing Assistance: Limited assistance Feeding assistance: Limited assistance Dressing Assistance: Limited assistance     Functional Limitations Info  Sight, Hearing, Speech Sight Info: Adequate Hearing Info: Adequate Speech Info: Adequate    SPECIAL CARE FACTORS FREQUENCY                       Contractures      Additional Factors Info  Code Status, Allergies Code Status Info:  (DNR ) Allergies Info:  (No Known Allergies )           Current Medications (07/30/2015):  This is the current hospital active medication list Current Facility-Administered Medications   Medication Dose Route Frequency Provider Last Rate Last Dose  . acetaminophen (TYLENOL) tablet 650 mg  650 mg Oral Q6H PRN Katha HammingSnehalatha Konidena, MD   650 mg at 07/30/15 0849   Or  . acetaminophen (TYLENOL) suppository 650 mg  650 mg Rectal Q6H PRN Katha HammingSnehalatha Konidena, MD      . aspirin EC tablet 81 mg  81 mg Oral Daily Katha HammingSnehalatha Konidena, MD   81 mg at 07/30/15 0842  . bisacodyl (DULCOLAX) EC tablet 5 mg  5 mg Oral Daily PRN Katha HammingSnehalatha Konidena, MD   5 mg at 07/30/15 0849  . cyanocobalamin tablet 500 mcg  500 mcg Oral Daily Katha HammingSnehalatha Konidena, MD   500 mcg at 07/30/15 0843  . divalproex (DEPAKOTE SPRINKLE) capsule 250 mg  250 mg Oral BID Katha HammingSnehalatha Konidena, MD   250 mg at 07/30/15 0842  . docusate sodium (COLACE) capsule 100 mg  100 mg Oral BID Katha HammingSnehalatha Konidena, MD   100 mg at 07/30/15 0842  . donepezil (ARICEPT) tablet 10 mg  10 mg Oral QHS Katha HammingSnehalatha Konidena, MD   10 mg at 07/29/15 2121  . ferrous sulfate tablet 325 mg  325 mg Oral Q breakfast Katha HammingSnehalatha Konidena, MD   325 mg at 07/30/15 0842  . heparin injection 5,000 Units  5,000 Units Subcutaneous Q8H Katha HammingSnehalatha Konidena, MD   5,000 Units at 07/30/15 0533  . levothyroxine (  SYNTHROID, LEVOTHROID) tablet 75 mcg  75 mcg Oral QAC breakfast Katha Hamming, MD   75 mcg at 07/30/15 0842  . LORazepam (ATIVAN) tablet 0.5 mg  0.5 mg Oral BID PRN Katha Hamming, MD   0.5 mg at 07/29/15 1425  . meloxicam (MOBIC) tablet 7.5 mg  7.5 mg Oral Daily Katha Hamming, MD   7.5 mg at 07/30/15 0841  . memantine (NAMENDA) tablet 10 mg  10 mg Oral BID Katha Hamming, MD   10 mg at 07/30/15 0842  . ondansetron (ZOFRAN) tablet 4 mg  4 mg Oral Q6H PRN Katha Hamming, MD       Or  . ondansetron (ZOFRAN) injection 4 mg  4 mg Intravenous Q6H PRN Katha Hamming, MD      . pantoprazole (PROTONIX) EC tablet 40 mg  40 mg Oral QAC breakfast Katha Hamming, MD   40 mg at 07/30/15 0842  . potassium chloride SA (K-DUR,KLOR-CON) CR  tablet 20 mEq  20 mEq Oral Daily Katha Hamming, MD   20 mEq at 07/30/15 0842  . QUEtiapine (SEROQUEL) tablet 25 mg  25 mg Oral BID Katha Hamming, MD   25 mg at 07/30/15 0842  . sertraline (ZOLOFT) tablet 75 mg  75 mg Oral Daily Katha Hamming, MD   75 mg at 07/30/15 0841  . simvastatin (ZOCOR) tablet 20 mg  20 mg Oral QHS Katha Hamming, MD   20 mg at 07/29/15 2121     Discharge Medications: Please see discharge summary for a list of discharge medications.  Relevant Imaging Results:  Relevant Lab Results:   Additional Information  (SSN: [id number])  Haig Prophet, LCSW

## 2015-07-30 NOTE — Progress Notes (Signed)
[facility] Phys[facility] - [place] at [facility]   PATIENT NAME: Alicia [name]    MR#:  [id number]  DATE OF BIRTH:  1930/04/26  SUBJECTIVE; the patient is awake but has baseline dementia. The family is requesting higher level of care. Spoke with Child psychotherapist and she requested physical therapy consult. .  CHIEF COMPLAINT:   Chief Complaint  Patient presents with  . Shoulder Pain  . Altered Mental Status    REVIEW OF SYSTEMS:   Review of Systems  Unable to perform ROS: dementia  , weakness.   No Known Allergies  VITALS:  Blood pressure 154/85, pulse 63, temperature 98.1 F (36.7 C), temperature source Oral, resp. rate 18, height  (1.448 m), weight 57.199 kg (126 lb 1.6 oz), SpO2 95 %.  PHYSICAL EXAMINATION:  GENERAL:  82 y.o.-year-old patient lying in the bed with no acute distress.  EYES: Pupils equal, round, reactive to light and accommodation. No scleral icterus. Extraocular muscles intact.  HEENT: Head atraumatic, normocephalic. Oropharynx and nasopharynx clear.  NECK:  Supple, no jugular venous distention. No thyroid enlargement, no tenderness.  LUNGS: Normal breath sounds bilaterally, no wheezing, rales,rhonchi or crepitation. No use of accessory muscles of respiration.  CARDIOVASCULAR: S1, S2 normal. No murmurs, rubs, or gallops.  ABDOMEN: Soft, nontender, nondistended. Bowel sounds present. No organomegaly or mass.  EXTREMITIES: No pedal edema, cyanosis, or clubbing.  NEUROLOGIC:   Not able to  Do full neurological  Exam  Due to dementeia. PSYCHIATRIC: The patient is awake but demented. SKIN: No obvious rash, lesion, or ulcer.    LABORATORY PANEL:   CBC  Recent Labs Lab 07/29/15 0531  WBC 9.3  HGB 12.7  HCT 37.0  PLT 189   ------------------------------------------------------------------------------------------------------------------  Chemistries   Recent Labs Lab 07/28/15 1445 07/29/15 0531  NA 138 138  K 3.8 3.7  CL 104 108  CO2  24 24  GLUCOSE 114* 99  BUN 29* 19  CREATININE 1.08* 0.67  CALCIUM 9.3 8.6*  AST 42*  --   ALT 40  --   ALKPHOS 77  --   BILITOT 0.9  --    ------------------------------------------------------------------------------------------------------------------  Cardiac Enzymes  Recent Labs Lab 07/25/15 1353  TROPONINI <0.03   ------------------------------------------------------------------------------------------------------------------  RADIOLOGY:  Dg Chest 1 View  07/28/2015  CLINICAL DATA:  Multiple recent falls. EXAM: CHEST 1 VIEW COMPARISON:  Chest radiograph 07/24/2015 FINDINGS: Cardiomediastinal silhouette is normal. Mediastinal contours appear intact. There is no evidence of focal airspace consolidation, pleural effusion or pneumothorax. Increased interstitial markings are likely artifactual due to low lung volumes. Osseous structures demonstrates several left-sided lateral mildly displaced rib fractures, mainly left lateral second, third, fourth, and fifth ribs. Soft tissues are grossly normal. IMPRESSION: Low lung volumes. Several left-sided mildly displaced rib fractures. Electronically Signed   By: Ted Mcalpine M.D.   On: 07/28/2015 16:08   Dg Pelvis 1-2 Views  07/28/2015  CLINICAL DATA:  History of fall 07/27/2015. Pelvic pain. Initial encounter. EXAM: PELVIS - 1-2 VIEW COMPARISON:  Single-view of the pelvis 07/24/2015. FINDINGS: No acute bony or joint abnormality is identified. Advanced left hip osteoarthritis again seen. No focal bony lesion is identified. IMPRESSION: No acute abnormality. Electronically Signed   By: Drusilla Kanner M.D.   On: 07/28/2015 16:04   Ct Head Wo Contrast  07/28/2015  CLINICAL DATA:  Altered mental status.  Fall 4 days prior EXAM: CT HEAD WITHOUT CONTRAST TECHNIQUE: Contiguous axial images were obtained from the base of the skull through the vertex  without intravenous contrast. COMPARISON:  July 24, 2015 FINDINGS: There is mild diffuse  atrophy, stable. There is no intracranial mass effect, hemorrhage, extra-axial fluid collection, or midline shift. Patchy small vessel disease in the centra semiovale bilaterally is stable. Small vessel disease in the internal and external capsules bilaterally are likewise stable. There is no new gray-white compartment lesion. No acute infarct is evident. The bony calvarium appears intact. Small benign enostoses on the right are stable. It is possible that the larger apparent enostosis actually represents a small calcified meningioma without mass effect or edema. This finding has no clinical significance peer Mastoid air cells are clear. No intraorbital lesions are evident. There is advanced arthropathy in both temporomandibular joints, more severe on the left than on the right. IMPRESSION: Stable atrophy with periventricular small vessel disease. No intracranial mass effect or edema, hemorrhage, or extra-axial fluid collection. No acute appearing infarct. Question small right frontal -parietal junction enostosis versus calcified meningioma without surrounding edema. This is a benign lesion. There is advanced arthropathy in both temporomandibular joints, more severe on the left than on the right. There is marked bony overgrowth in the distal left mandibular condyle region. Electronically Signed   By: Bretta BangWilliam  Woodruff III M.D.   On: 07/28/2015 16:00    EKG:   Orders placed or performed during the hospital encounter of 07/28/15  . ED EKG  . ED EKG  . EKG 12-Lead  . EKG 12-Lead  . EKG 12-Lead  . EKG 12-Lead    ASSESSMENT AND PLAN:  Lethargy due to narcotics;and ativan;improved after stopping narco.Patient back to baseline. But because of her advanced dementia it may be new normal.discussed this with patient's son, patient's son is requesting higher level of care likely SNF placement. Social worker recommended physical therapy consult. Patient cannot be discharged as son does not feel safe  For her  to  go back to him place. 2.recent fall and left rib fracture;continue tylenol or motirin ,avoid narcotics 3.htn;controlled 4.recurrent falls due to advanced  Dementia; 5.hypothyroidsim Advanced alzeimers  Dementia;on depakote,dementia,zoloft,seroquel D/w son Discussed with patient's son, Child psychotherapistsocial worker, time spent more than 20 minutes on coordination and counseling of care     All the records are reviewed and case discussed with Care Management/Social Workerr. Management plans discussed with the patient, family and they are in agreement.  CODE STATUS:DNR  TOTAL TIME TAKING CARE OF THIS PATIENT: 35 minutes.   POSSIBLE D/C IN 1-2DAYS, DEPENDING ON CLINICAL CONDITION.   Katha HammingKONIDENA,Mikaele Stecher M.D on 07/30/2015 at 9:53 AM  Between 7am to 6pm - Pager - [phone]  After 6pm go to [email or web address] - password [email or web address]  [facility]  Office  [phone]  CC: Primary care physician; Barbette ReichmannHANDE,VISHWANATH, MD   Note: This dictation was prepared with Dragon dictation along with smaller phrase technology. Any transcriptional errors that result from this process are unintentional.

## 2015-07-30 NOTE — Progress Notes (Signed)
Patient's son [name] is concerned that patient will not get the level of care she needs at [facility]. Clinical Child psychotherapistocial Worker (CSW) requested a PT consult from MD and discussed SNF options with son. Son is agreeable for [place] in [place] and [place] [facility]. Patient did not have an existing PASARR and a SNF level PASARR was received today. CSW will continue to follow and assist as needed.   Jetta LoutBailey Morgan, LCSW [phone]

## 2015-07-31 DIAGNOSIS — G92 Toxic encephalopathy: Secondary | ICD-10-CM | POA: Diagnosis not present

## 2015-07-31 LAB — GLUCOSE, CAPILLARY
Glucose-Capillary: 75 mg/dL (ref 65–99)
Glucose-Capillary: 121 mg/dL — ABNORMAL HIGH (ref 65–99)

## 2015-07-31 MED ORDER — LORAZEPAM 0.5 MG PO TABS: 0.5000 mg | ORAL_TABLET | Freq: Two times a day (BID) | ORAL | Status: DC | PRN

## 2015-07-31 MED ORDER — ACETAMINOPHEN 325 MG PO TABS: 650.0000 mg | ORAL_TABLET | Freq: Four times a day (QID) | ORAL | Status: AC | PRN

## 2015-07-31 NOTE — Discharge Summary (Signed)
Alicia [name], 82 y.o., DOB [birth year], MRN [id number]. Admission date: 07/28/2015 Discharge Date 07/31/2015 Primary MD Barbette Reichmann, MD Admitting Physician Katha Hamming, MD  Admission Diagnosis  Altered mental status, unspecified altered mental status type [R41.82]  Discharge Diagnosis   Active Problems:  Acute encephalopathy due to medications  Recent  Fall  Recent left rib fracture  Hypertension essential Hypothyroidism Alzheimer's dementia     Hospital Course  Patient is a 82 year old with history of dementia, essential hypertension, heparin lipidemia who was seen in the ED due to lethargy. Patient's recently had a rib fracture and was started on Norco. It was felt that her lethargy was due to medications. Her Norco was stopped. Patient's mental status is much improved. She was seen by physical therapy recommended skilled nursing facility. Patient doing much better and is stable for discharge.         Consults  None  Significant Tests:  See full reports for all details      Dg Chest 1 View  07/28/2015  CLINICAL DATA:  Multiple recent falls. EXAM: CHEST 1 VIEW COMPARISON:  Chest radiograph 07/24/2015 FINDINGS: Cardiomediastinal silhouette is normal. Mediastinal contours appear intact. There is no evidence of focal airspace consolidation, pleural effusion or pneumothorax. Increased interstitial markings are likely artifactual due to low lung volumes. Osseous structures demonstrates several left-sided lateral mildly displaced rib fractures, mainly left lateral second, third, fourth, and fifth ribs. Soft tissues are grossly normal. IMPRESSION: Low lung volumes. Several left-sided mildly displaced rib fractures. Electronically Signed   By: Ted Mcalpine M.D.   On: 07/28/2015 16:08   Dg Ribs Unilateral W/chest Left  07/24/2015  CLINICAL DATA:  Left upper/anterior chest wall pain status post unwitnessed fall. EXAM: LEFT RIBS AND CHEST - 3+ VIEW COMPARISON:  Chest  radiographs 10/23/2013. Thoracic spine radiographs 04/11/2014. FINDINGS: The bones are mildly demineralized. There are several left lateral rib deformities involving the second through sixth ribs consistent with fractures. Some of these appear healed, while others could be subacute. No displaced rib fracture, pneumothorax or significant pleural effusion identified. The heart size and mediastinal contours are stable. The lungs are clear. Left upper quadrant abdominal calcification is unchanged, corresponding with a renal calcification on prior CT from 2014. IMPRESSION: Several age indeterminate nondisplaced left-sided rib fractures are present. No evidence of pneumothorax or pleural effusion. Electronically Signed   By: Carey Bullocks M.D.   On: 07/24/2015 17:07   Dg Pelvis 1-2 Views  07/28/2015  CLINICAL DATA:  History of fall 07/27/2015. Pelvic pain. Initial encounter. EXAM: PELVIS - 1-2 VIEW COMPARISON:  Single-view of the pelvis 07/24/2015. FINDINGS: No acute bony or joint abnormality is identified. Advanced left hip osteoarthritis again seen. No focal bony lesion is identified. IMPRESSION: No acute abnormality. Electronically Signed   By: Drusilla Kanner M.D.   On: 07/28/2015 16:04   Dg Pelvis 1-2 Views  07/24/2015  CLINICAL DATA:  82 year old female with unwitnessed fall. Pain. Initial encounter. EXAM: PELVIS - 1-2 VIEW COMPARISON:  Right hip series 01/21/13 FINDINGS: Chronic asymmetric increased sclerosis and osteophytosis about the left hip appears stable. Both proximal femurs appear grossly stable and intact. Pelvis intact. SI joints appear stable. IMPRESSION: No acute fracture or dislocation identified about the pelvis. If there is lateralizing hip pain, recommend dedicated hip series. Electronically Signed   By: Odessa Fleming M.D.   On: 07/24/2015 17:11   Ct Head Wo Contrast  07/28/2015  CLINICAL DATA:  Altered mental status.  Fall 4 days prior EXAM: CT HEAD WITHOUT  CONTRAST TECHNIQUE: Contiguous  axial images were obtained from the base of the skull through the vertex without intravenous contrast. COMPARISON:  July 24, 2015 FINDINGS: There is mild diffuse atrophy, stable. There is no intracranial mass effect, hemorrhage, extra-axial fluid collection, or midline shift. Patchy small vessel disease in the centra semiovale bilaterally is stable. Small vessel disease in the internal and external capsules bilaterally are likewise stable. There is no new gray-white compartment lesion. No acute infarct is evident. The bony calvarium appears intact. Small benign enostoses on the right are stable. It is possible that the larger apparent enostosis actually represents a small calcified meningioma without mass effect or edema. This finding has no clinical significance peer Mastoid air cells are clear. No intraorbital lesions are evident. There is advanced arthropathy in both temporomandibular joints, more severe on the left than on the right. IMPRESSION: Stable atrophy with periventricular small vessel disease. No intracranial mass effect or edema, hemorrhage, or extra-axial fluid collection. No acute appearing infarct. Question small right frontal -parietal junction enostosis versus calcified meningioma without surrounding edema. This is a benign lesion. There is advanced arthropathy in both temporomandibular joints, more severe on the left than on the right. There is marked bony overgrowth in the distal left mandibular condyle region. Electronically Signed   By: Bretta Bang III M.D.   On: 07/28/2015 16:00   Ct Head Wo Contrast  07/24/2015  CLINICAL DATA:  Recent fall EXAM: CT HEAD WITHOUT CONTRAST TECHNIQUE: Contiguous axial images were obtained from the base of the skull through the vertex without intravenous contrast. COMPARISON:  10/16/2014 FINDINGS: Bony calvarium is intact. Degenerative changes of the left temporomandibular joint are seen and stable. Diffuse atrophic changes stable from the prior exam. No  findings to suggest acute hemorrhage, acute infarction or space-occupying mass lesion are seen. Calcified meningioma is again noted in the right frontoparietal region. IMPRESSION: No acute abnormality noted.  Stable chronic atrophic changes. Electronically Signed   By: Alcide Clever M.D.   On: 07/24/2015 17:07   Dg Hip Unilat With Pelvis 2-3 Views Left  07/24/2015  CLINICAL DATA:  Fall today with left hip pain, initial encounter EXAM: DG HIP (WITH OR WITHOUT PELVIS) 2-3V LEFT COMPARISON:  10/16/2014 FINDINGS: Degenerative changes of left hip joint are noted. No acute fracture or dislocation is seen. No gross soft tissue abnormality is noted. IMPRESSION: No acute abnormality noted. Electronically Signed   By: Alcide Clever M.D.   On: 07/24/2015 20:39   Dg Hip Unilat With Pelvis 2-3 Views Right  07/24/2015  CLINICAL DATA:  Left hip pain after fall. EXAM: DG HIP (WITH OR WITHOUT PELVIS) 2-3V RIGHT COMPARISON:  Left hip 07/24/2015 FINDINGS: Right hip is located without a fracture. Right pubic rami are intact. IMPRESSION: No acute abnormality to the right hip. Electronically Signed   By: Richarda Overlie M.D.   On: 07/24/2015 20:42       Today   Subjective:   Alicia [name] more awake and denies any complaints  Objective:   Blood pressure 145/69, pulse 70, temperature 98.6 F (37 C), temperature source Oral, resp. rate 18, height 4\' 9"  (1.448 m), weight 57.607 kg (127 lb), SpO2 97 %.  .  Intake/Output Summary (Last 24 hours) at 07/31/15 1156 Last data filed at 07/31/15 0900  Gross per 24 hour  Intake      0 ml  Output      1 ml  Net     -1 ml    Exam VITAL SIGNS:  Blood pressure 145/69, pulse 70, temperature 98.6 F (37 C), temperature source Oral, resp. rate 18, height 4\' 9"  (1.448 m), weight 57.607 kg (127 lb), SpO2 97 %.  GENERAL:  82 y.o.-year-old patient lying in the bed with no acute distress.  EYES: Pupils equal, round, reactive to light and accommodation. No scleral icterus. Extraocular  muscles intact.  HEENT: Head atraumatic, normocephalic. Oropharynx and nasopharynx clear.  NECK:  Supple, no jugular venous distention. No thyroid enlargement, no tenderness.  LUNGS: Normal breath sounds bilaterally, no wheezing, rales,rhonchi or crepitation. No use of accessory muscles of respiration.  CARDIOVASCULAR: S1, S2 normal. No murmurs, rubs, or gallops.  ABDOMEN: Soft, nontender, nondistended. Bowel sounds present. No organomegaly or mass.  EXTREMITIES: No pedal edema, cyanosis, or clubbing.  NEUROLOGIC: Cranial nerves II through XII are intact. Muscle strength 5/5 in all extremities. Sensation intact. Gait not checked.  PSYCHIATRIC: The patient is alert and oriented x 3.  SKIN: No obvious rash, lesion, or ulcer.   Data Review     CBC w Diff: Lab Results  Component Value Date   WBC 9.3 07/29/2015   WBC 8.1 05/04/2014   HGB 12.7 07/29/2015   HGB 11.4* 05/04/2014   HCT 37.0 07/29/2015   HCT 36.2 05/04/2014   PLT 189 07/29/2015   PLT 273 05/04/2014   LYMPHOPCT 17 07/24/2015   LYMPHOPCT 16.1 05/04/2014   MONOPCT 8 07/24/2015   MONOPCT 8.5 05/04/2014   EOSPCT 5 07/24/2015   EOSPCT 3.2 05/04/2014   BASOPCT 0 07/24/2015   BASOPCT 0.6 05/04/2014   CMP: Lab Results  Component Value Date   NA 138 07/29/2015   NA 138 05/04/2014   K 3.7 07/29/2015   K 2.9* 05/04/2014   CL 108 07/29/2015   CL 105 05/04/2014   CO2 24 07/29/2015   CO2 25 05/04/2014   BUN 19 07/29/2015   BUN 11 05/04/2014   CREATININE 0.67 07/29/2015   CREATININE 1.10* 05/04/2014   PROT 7.4 07/28/2015   PROT 6.9 01/11/2013   ALBUMIN 3.9 07/28/2015   ALBUMIN 3.6 01/11/2013   BILITOT 0.9 07/28/2015   BILITOT 0.2 01/11/2013   ALKPHOS 77 07/28/2015   ALKPHOS 149* 01/11/2013   AST 42* 07/28/2015   AST 26 01/11/2013   ALT 40 07/28/2015   ALT 22 01/11/2013  .  Micro Results Recent Results (from the past 240 hour(s))  MRSA PCR Screening     Status: None   Collection Time: 07/24/15 11:58 PM  Result  Value Ref Range Status   MRSA by PCR NEGATIVE NEGATIVE Final    Comment:        The GeneXpert MRSA Assay (FDA approved for NASAL specimens only), is one component of a comprehensive MRSA colonization surveillance program. It is not intended to diagnose MRSA infection nor to guide or monitor treatment for MRSA infections.         Code Status Orders        Start     Ordered   07/28/15 1746  Do not attempt resuscitation (DNR)   Continuous    Question Answer Comment  In the event of cardiac or respiratory ARREST Do not call a "code blue"   In the event of cardiac or respiratory ARREST Do not perform Intubation, CPR, defibrillation or ACLS   In the event of cardiac or respiratory ARREST Use medication by any route, position, wound care, and other measures to relive pain and suffering. May use oxygen, suction and manual treatment of airway obstruction as needed  for comfort.      07/28/15 1747    Code Status History    Date Active Date Inactive Code Status Order ID Comments User Context   07/24/2015 11:27 PM 07/26/2015  6:26 PM DNR [id number]  Gery Pray, MD Inpatient          Follow-up Information    Follow up with [facility], MD In 2 weeks.   Specialty:  Internal Medicine   Contact information:   [address] [place] [address] [phone]       Discharge Medications     Medication List    STOP taking these medications        HYDROcodone-acetaminophen 5-325 MG tablet  Commonly known as:  NORCO      TAKE these medications        aspirin EC 81 MG tablet  Take 81 mg by mouth daily.     cyanocobalamin 500 MCG tablet  Take 500 mcg by mouth daily.     divalproex 125 MG capsule  Commonly known as:  DEPAKOTE SPRINKLE  Take 250 mg by mouth 2 (two) times daily.     donepezil 10 MG tablet  Commonly known as:  ARICEPT  Take 10 mg by mouth at bedtime.     ferrous sulfate 325 (65 FE) MG EC tablet  Take 325 mg by  mouth daily with breakfast.     levothyroxine 75 MCG tablet  Commonly known as:  SYNTHROID, LEVOTHROID  Take 75 mcg by mouth daily before breakfast.     LORazepam 0.5 MG tablet  Commonly known as:  ATIVAN  Take 1 tablet (0.5 mg total) by mouth 2 (two) times daily as needed for anxiety (and/or agitation).     meloxicam 7.5 MG tablet  Commonly known as:  MOBIC  Take 7.5 mg by mouth daily.     memantine 10 MG tablet  Commonly known as:  NAMENDA  Take 10 mg by mouth 2 (two) times daily.     pantoprazole 40 MG tablet  Commonly known as:  PROTONIX  Take 40 mg by mouth daily before breakfast.     potassium chloride SA 20 MEQ tablet  Commonly known as:  K-DUR,KLOR-CON  Take 20 mEq by mouth daily.     QUEtiapine 25 MG tablet  Commonly known as:  SEROQUEL  Take 25 mg by mouth 2 (two) times daily.     sertraline 50 MG tablet  Commonly known as:  ZOLOFT  Take 75 mg by mouth daily.     simvastatin 20 MG tablet  Commonly known as:  ZOCOR  Take 20 mg by mouth at bedtime.           Total Time in preparing paper work, data evaluation and todays exam - 35 minutes  Auburn Bilberry M.D on 07/31/2015 at 11:56 AM  [facility]   Office  [phone]

## 2015-07-31 NOTE — Clinical Social Work Note (Signed)
Pt is ready for discharge today. CSW met with pt and family to present bed offers. Pt's son chose [facility]. CSW updated facility, and they are able to accept pt today as they have received discharge information. Pt and family are agreeable to discharge plan. RN to call report and EMS to provide transportation. CSW is signing off as no further needs identified.   Alicia [name], MSW, LCSW Clinical Social Worker  [phone]

## 2015-07-31 NOTE — Progress Notes (Signed)
Report called to [facility]ela at [facility], EMS called for transport, son at bedside and aware, pt with no noted complaints, no distress or discomfort noted

## 2015-07-31 NOTE — Progress Notes (Signed)
Checked to see if pre-authorization would be needed for non-emergent EMS transport. Per [employer] benefits obtained online through [employer], patient has a [employer] Medicare Advantage PPO policy.  Medicare PPO plans do not require pre-auth for non-emergent ground transports using service codes A0426 or A0428.   

## 2015-07-31 NOTE — Clinical Social Work Placement (Signed)
   CLINICAL SOCIAL WORK PLACEMENT  NOTE  Date:  07/31/2015  Patient Details  Name: Alicia LeachWilma B Mazur MRN: [id number] Date of Birth: 09/09/1930  Clinical Social Work is seeking post-discharge placement for this patient at the Skilled  Nursing Facility level of care (*CSW will initial, date and re-position this form in  chart as items are completed):  Yes   Patient/family provided with [facility] Clinical Social Work Department's list of facilities offering this level of care within the geographic area requested by the patient (or if unable, by the patient's family).  Yes   Patient/family informed of their freedom to choose among providers that offer the needed level of care, that participate in Medicare, Medicaid or managed care program needed by the patient, have an available bed and are willing to accept the patient.  Yes   Patient/family informed of [facility] ownership interest in [facility] and [facility], as well as of the fact that they are under no obligation to receive care at these facilities.  PASRR submitted to EDS on 07/30/15     PASRR number received on 07/30/15     Existing PASRR number confirmed on       FL2 transmitted to all facilities in geographic area requested by pt/family on 07/30/15     FL2 transmitted to all facilities within larger geographic area on       Patient informed that his/her managed care company has contracts with or will negotiate with certain facilities, including the following:        Yes   Patient/family informed of bed offers received.  [facility]     Physician recommends and patient chooses bed at  [facility])    Patient to be transferred [facility] on 07/31/15.  Patient to be transferred to facility [facility]     Patient family notified on 07/31/15 of transfer.  Name of family member notified:  Pt's son [name]     PHYSICIAN       Additional  Comment:    _______________________________________________ Dede QuerySarah Carmencita Cusic, LCSW 07/31/2015, 5:10 PM

## 2015-07-31 NOTE — Discharge Instructions (Signed)

## 2015-07-31 NOTE — Care Management Obs Status (Signed)
MEDICARE OBSERVATION STATUS NOTIFICATION   Patient Details  Name: Alicia [name] MRN: [id number] Date of Birth: 11/03/1930   Medicare Observation Status Notification Given:  Yes    Alicia GreetBrenda S Steffie Waggoner, RN 07/31/2015, 9:28 AM

## 2015-07-31 NOTE — Progress Notes (Addendum)
EMS here at this time for transport, family at bedside, pt with no noted complaints, no distress or discomfort noted, incontinent care provided prior to transport, family at bedside

## 2015-09-12 ENCOUNTER — Encounter: Payer: Self-pay | Admitting: Emergency Medicine

## 2015-09-12 ENCOUNTER — Emergency Department: Payer: Medicare Other

## 2015-09-12 ENCOUNTER — Emergency Department
Admission: EM | Admit: 2015-09-12 | Discharge: 2015-09-12 | Disposition: A | Discharge status: Home / Self Care | Payer: Medicare Other | Attending: Student | Admitting: Student

## 2015-09-12 DIAGNOSIS — W1809XA Striking against other object with subsequent fall, initial encounter: Secondary | ICD-10-CM | POA: Diagnosis not present

## 2015-09-12 DIAGNOSIS — Y939 Activity, unspecified: Secondary | ICD-10-CM | POA: Insufficient documentation

## 2015-09-12 DIAGNOSIS — Z7982 Long term (current) use of aspirin: Secondary | ICD-10-CM | POA: Diagnosis not present

## 2015-09-12 DIAGNOSIS — Y999 Unspecified external cause status: Secondary | ICD-10-CM | POA: Diagnosis not present

## 2015-09-12 DIAGNOSIS — S0003XA Contusion of scalp, initial encounter: Secondary | ICD-10-CM | POA: Diagnosis not present

## 2015-09-12 DIAGNOSIS — I10 Essential (primary) hypertension: Secondary | ICD-10-CM | POA: Diagnosis not present

## 2015-09-12 DIAGNOSIS — W19XXXA Unspecified fall, initial encounter: Secondary | ICD-10-CM

## 2015-09-12 DIAGNOSIS — S0990XA Unspecified injury of head, initial encounter: Secondary | ICD-10-CM

## 2015-09-12 DIAGNOSIS — Y9289 Other specified places as the place of occurrence of the external cause: Secondary | ICD-10-CM | POA: Diagnosis not present

## 2015-09-12 NOTE — ED Notes (Signed)
Patient's discharge and follow up information reviewed with patient's care giver (son) by ED nursing staff and patient given the opportunity to ask questions pertaining to ED visit and discharge plan of care. Patient advised that should symptoms not continue to improve, resolve entirely, or should new symptoms develop then a follow up visit with their PCP or a return visit to the ED may be warranted. Patient verbalized consent and understanding of discharge plan of care including potential need for further evaluation. Patient discharged in stable condition per attending ED physician on duty.

## 2015-09-12 NOTE — ED Triage Notes (Signed)
Pt presents to ED via EMS from [facility] c/o witnessed fall with head involvement. Pt noted to have hematoma to L forehead, struck head on window sill. No c/o pain, dizziness, n/v. No LOC. Pt stays in alzheimer's care unit at [facility] and is confused at baseline. VS WNL. ASA noted on MAR but no other blood thinners.

## 2015-09-12 NOTE — ED Provider Notes (Signed)
[facility] Emergency Department Provider Note   ____________________________________________   First MD Initiated Contact with Patient 09/12/15 [phone]     (approximate)  I have reviewed the triage vital signs and the nursing notes.   HISTORY  Chief Complaint Fall and Head Injury  Caveat-history of present illness and review of systems Limited due to the patient's dementia. All information is obtained from EMS on their arrival, staff at [facility] as well as her family at bedside.  HPI Alicia [name] is a 82 y.o. female with history of dementia, GERD, hypertension, thyroid disease, hyperlipidemia, on baby aspirin daily and presents for evaluation of mechanical fall with head injury that occurred suddenly just prior to arrival at her living facility [facility]. According to staff, they witnessed her stumble and fell forward and hit the left side of her head on the wall. No loss of consciousness. No vomiting. According to her son, she is otherwise been in her usual state of health without illness. No fevers or chills. No cough and sneezing or runny nose. The patient has no pain complaints at this time. Son reports that her tetanus vaccine is up-to-date.   Past Medical History:  Diagnosis Date  . Anemia   . Dementia   . GERD (gastroesophageal reflux disease)   . High cholesterol   . Hypertension   . Low blood potassium   . Thyroid disease    hypothyroidism    Patient Active Problem List   Diagnosis Date Noted  . Lethargy 07/28/2015  . Chest pain 07/24/2015  . Dementia 07/24/2015  . Hypothyroidism 07/24/2015  . Fall 07/24/2015  . HTN (hypertension) 07/24/2015    Past Surgical History:  Procedure Laterality Date  . ABDOMINAL HYSTERECTOMY    . SHOULDER SURGERY Right     Prior to Admission medications   Medication Sig Start Date End Date Taking? Authorizing Provider  acetaminophen (TYLENOL) 325 MG tablet Take 2 tablets (650 mg total) by mouth every 6  (six) hours as needed for mild pain (or Fever >/= 101). 07/31/15   Auburn Bilberry, MD  aspirin EC 81 MG tablet Take 81 mg by mouth daily.    Historical Provider, MD  cyanocobalamin 500 MCG tablet Take 500 mcg by mouth daily.    Historical Provider, MD  divalproex (DEPAKOTE SPRINKLE) 125 MG capsule Take 250 mg by mouth 2 (two) times daily.    Historical Provider, MD  donepezil (ARICEPT) 10 MG tablet Take 10 mg by mouth at bedtime.     Historical Provider, MD  ferrous sulfate 325 (65 FE) MG EC tablet Take 325 mg by mouth daily with breakfast.     Historical Provider, MD  levothyroxine (SYNTHROID, LEVOTHROID) 75 MCG tablet Take 75 mcg by mouth daily before breakfast.     Historical Provider, MD  LORazepam (ATIVAN) 0.5 MG tablet Take 1 tablet (0.5 mg total) by mouth 2 (two) times daily as needed for anxiety (and/or agitation). 07/31/15   Auburn Bilberry, MD  meloxicam (MOBIC) 7.5 MG tablet Take 7.5 mg by mouth daily.     Historical Provider, MD  memantine (NAMENDA) 10 MG tablet Take 10 mg by mouth 2 (two) times daily.     Historical Provider, MD  pantoprazole (PROTONIX) 40 MG tablet Take 40 mg by mouth daily before breakfast.     Historical Provider, MD  potassium chloride SA (K-DUR,KLOR-CON) 20 MEQ tablet Take 20 mEq by mouth daily.     Historical Provider, MD  QUEtiapine (SEROQUEL) 25 MG tablet Take  25 mg by mouth 2 (two) times daily.    Historical Provider, MD  sertraline (ZOLOFT) 50 MG tablet Take 75 mg by mouth daily.     Historical Provider, MD  simvastatin (ZOCOR) 20 MG tablet Take 20 mg by mouth at bedtime.     Historical Provider, MD    Allergies Review of patient's allergies indicates no known allergies.  Family History  Problem Relation Age of Onset  . Breast cancer Sister 7    Social History Social History  Substance Use Topics  . Smoking status: Never Smoker  . Smokeless tobacco: Never Used  . Alcohol use No    Review of Systems   Caveat-history of present illness and  review of systems Limited due to the patient's dementia. All information is obtained from EMS on their arrival, staff at [facility] as well as her family at bedside. ____________________________________________   PHYSICAL EXAM:  Vitals:   09/12/15 2024 09/12/15 2157  BP: (!) 153/56 (!) 157/75  Pulse: 64 60  Resp: 20 13  Temp: 98.7 F (37.1 C)   TempSrc: Oral   SpO2: 97% 100%  Weight: 152 lb 5.4 oz (69.1 kg)   Height: 5' (1.524 m)     VITAL SIGNS: ED Triage Vitals [09/12/15 2024]  Enc Vitals Group     BP (!) 153/56     Pulse Rate 64     Resp 20     Temp 98.7 F (37.1 C)     Temp Source Oral     SpO2 97 %     Weight 152 lb 5.4 oz (69.1 kg)     Height 5' (1.524 m)     Head Circumference      Peak Flow      Pain Score      Pain Loc      Pain Edu?      Excl. in GC?     Constitutional: Alert and oriented To self but not to year or place. Pleasantly demented.. Well appearing and in no acute distress. Eyes: Conjunctivae are normal. PERRL. EOMI. Head: Metoprolol in the left forehead with overlying abrasion which is hemostatic. Nose: No congestion/rhinnorhea. Mouth/Throat: Mucous membranes are moist.  Oropharynx non-erythematous. Neck: No stridor.  No cervical spine tenderness to palpation. Cardiovascular: Normal rate, regular rhythm. Grossly normal heart sounds.  Good peripheral circulation. Respiratory: Normal respiratory effort.  No retractions. Lungs CTAB. Gastrointestinal: Soft and nontender. No distention.  No CVA tenderness. Genitourinary: deferred Musculoskeletal: No lower extremity tenderness nor edema.  No joint effusions. No midline T or L-spine tenderness to palpation. Pelvis stable to rock and compression, full active painless range of motion of bilateral hip joints. Mild tenderness to palpation throughout the chest wall bilaterally without flail chest, deformity, ecchymosis or other evidence of trauma. Neurologic:  Normal speech and language. No gross focal  neurologic deficits are appreciated. No gait instability. Skin:  Skin is warm, dry and intact. No rash noted. Psychiatric: Mood and affect are normal. Speech and behavior are normal.  ____________________________________________   LABS (all labs ordered are listed, but only abnormal results are displayed)  Labs Reviewed - No data to display ____________________________________________  EKG  ED ECG REPORT I, Gayla Doss, the attending physician, personally viewed and interpreted this ECG.   Date: 09/12/2015  EKG Time: 20:22  Rate: 63  Rhythm: normal EKG, normal sinus rhythm  Axis: normal  Intervals:none  ST&T Change: No acute ST elevation or acute ST depression.  ____________________________________________  RADIOLOGY  CT  head and c-spine IMPRESSION: 1. Left forehead scalp hematoma without underlying skull fracture. 2. Stable non contrast CT appearance of the brain. 3. No acute fracture or listhesis identified in the cervical spine. Ligamentous injury is not excluded. 4. Advanced odontoid and left cervical facet degeneration.  CXR FINDINGS: Lungs are adequately inflated without consolidation or effusion. No pneumothorax. Cardiomediastinal silhouette is within normal. There is calcified plaque over the thoracoabdominal aorta. There are degenerative changes of the spine. There are multiple displaced subacute left rib fractures unchanged.  IMPRESSION: No acute cardiopulmonary disease.  Aortic atherosclerosis. ____________________________________________   PROCEDURES  Procedure(s) performed: None  Procedures  Critical Care performed: No  ____________________________________________   INITIAL IMPRESSION / ASSESSMENT AND PLAN / ED COURSE  Pertinent labs & imaging results that were available during my care of the patient were reviewed by me and considered in my medical decision making (see chart for details).  Alicia [name] is a 82 y.o. female with  history of dementia, GERD, hypertension, thyroid disease, hyperlipidemia, on baby aspirin daily and presents for evaluation of mechanical fall with head injury that occurred suddenly just prior to arrival at her living facility [facility]. On exam, she is very well-appearing and in no acute distress. Her vital signs are stable and she is afebrile. She is awake and alert, oriented to self only which is her baseline. She has mild tenderness throughout the chest wall without any obvious deformity as well as a hematoma on the left forehead but otherwise her exam is atraumatic. She has an intact neurological examination. We'll obtain CT head and C-spine as well as chest x-ray. EKG reassuring.  ----------------------------------------- 10:22 PM on 09/12/2015 ----------------------------------------- CT head and C-spine negative for any acute traumatic pathology. Chest x-ray also shows no acute cardiopulmonary disease, no acute rib fracture, no pneumothorax. Patient ambulates well and is asking to go home,  her family is comfortable taking her home. We'll DC with return precautions and close PCP follow-up. We discussed meticulous return precautions and all are comfortable with the discharge plan.   Clinical Course     ____________________________________________   FINAL CLINICAL IMPRESSION(S) / ED DIAGNOSES  Final diagnoses:  Closed head injury, initial encounter  Fall, initial encounter      NEW MEDICATIONS STARTED DURING THIS VISIT:  New Prescriptions   No medications on file     Note:  This document was prepared using Dragon voice recognition software and may include unintentional dictation errors.    Gayla Doss, MD 09/12/15 2224

## 2015-09-12 NOTE — ED Notes (Signed)
Patient transported to CT 

## 2015-11-20 ENCOUNTER — Encounter: Payer: Self-pay | Admitting: *Deleted

## 2015-11-21 ENCOUNTER — Ambulatory Visit: Payer: Medicare Other | Admitting: Anesthesiology

## 2015-11-21 ENCOUNTER — Encounter
Admission: RE | Disposition: A | Discharge status: Home / Self Care | Payer: Self-pay | Source: Ambulatory Visit | Attending: Ophthalmology

## 2015-11-21 ENCOUNTER — Encounter: Payer: Self-pay | Admitting: *Deleted

## 2015-11-21 ENCOUNTER — Ambulatory Visit
Admission: RE | Admit: 2015-11-21 | Discharge: 2015-11-21 | Disposition: A | Discharge status: Home / Self Care | Payer: Medicare Other | Source: Ambulatory Visit | Attending: Ophthalmology | Admitting: Ophthalmology

## 2015-11-21 DIAGNOSIS — I10 Essential (primary) hypertension: Secondary | ICD-10-CM | POA: Insufficient documentation

## 2015-11-21 DIAGNOSIS — D649 Anemia, unspecified: Secondary | ICD-10-CM | POA: Insufficient documentation

## 2015-11-21 DIAGNOSIS — K219 Gastro-esophageal reflux disease without esophagitis: Secondary | ICD-10-CM | POA: Diagnosis not present

## 2015-11-21 DIAGNOSIS — H2512 Age-related nuclear cataract, left eye: Secondary | ICD-10-CM | POA: Diagnosis not present

## 2015-11-21 DIAGNOSIS — F0390 Unspecified dementia without behavioral disturbance: Secondary | ICD-10-CM | POA: Diagnosis not present

## 2015-11-21 DIAGNOSIS — Z6827 Body mass index (BMI) 27.0-27.9, adult: Secondary | ICD-10-CM | POA: Insufficient documentation

## 2015-11-21 DIAGNOSIS — Z79899 Other long term (current) drug therapy: Secondary | ICD-10-CM | POA: Diagnosis not present

## 2015-11-21 DIAGNOSIS — E669 Obesity, unspecified: Secondary | ICD-10-CM | POA: Diagnosis not present

## 2015-11-21 DIAGNOSIS — Z87891 Personal history of nicotine dependence: Secondary | ICD-10-CM | POA: Diagnosis not present

## 2015-11-21 HISTORY — DX: Unspecified fall, initial encounter: W19.XXXA

## 2015-11-21 HISTORY — PX: CATARACT EXTRACTION W/PHACO: SHX586

## 2015-11-21 HISTORY — DX: Edema, unspecified: R60.9

## 2015-11-21 SURGERY — PHACOEMULSIFICATION, CATARACT, WITH IOL INSERTION
Anesthesia: Monitor Anesthesia Care | Site: Eye | Laterality: Left | Wound class: Clean

## 2015-11-21 MED ORDER — MOXIFLOXACIN HCL 0.5 % OP SOLN
OPHTHALMIC | Status: AC
  Administered 2015-11-21: 1 [drp] via OPHTHALMIC
  Filled 2015-11-21: qty 3

## 2015-11-21 MED ORDER — EPINEPHRINE PF 1 MG/ML IJ SOLN
INTRAOCULAR | Status: DC | PRN
  Administered 2015-11-21: 1 mL via OPHTHALMIC

## 2015-11-21 MED ORDER — SODIUM CHLORIDE 0.9 % IV SOLN
INTRAVENOUS | Status: DC
  Administered 2015-11-21: 09:00:00 via INTRAVENOUS

## 2015-11-21 MED ORDER — POVIDONE-IODINE 5 % OP SOLN
OPHTHALMIC | Status: AC
  Filled 2015-11-21: qty 30

## 2015-11-21 MED ORDER — LIDOCAINE HCL (PF) 4 % IJ SOLN
INTRAMUSCULAR | Status: AC
  Filled 2015-11-21: qty 5

## 2015-11-21 MED ORDER — MOXIFLOXACIN HCL 0.5 % OP SOLN
1.0000 [drp] | OPHTHALMIC | Status: AC
  Administered 2015-11-21 (×3): 1 [drp] via OPHTHALMIC

## 2015-11-21 MED ORDER — POVIDONE-IODINE 5 % OP SOLN
OPHTHALMIC | Status: DC | PRN
  Administered 2015-11-21: 1 via OPHTHALMIC

## 2015-11-21 MED ORDER — ARMC OPHTHALMIC DILATING DROPS
1.0000 "application " | OPHTHALMIC | Status: AC
  Administered 2015-11-21 (×3): 1 via OPHTHALMIC
  Filled 2015-11-21: qty 0.4

## 2015-11-21 MED ORDER — INSULIN ASPART 100 UNIT/ML ~~LOC~~ SOLN
SUBCUTANEOUS | Status: AC
  Filled 2015-11-21: qty 5

## 2015-11-21 MED ORDER — EPINEPHRINE PF 1 MG/ML IJ SOLN
INTRAMUSCULAR | Status: AC
  Filled 2015-11-21: qty 2

## 2015-11-21 MED ORDER — NA CHONDROIT SULF-NA HYALURON 40-17 MG/ML IO SOLN
INTRAOCULAR | Status: AC
  Filled 2015-11-21: qty 1

## 2015-11-21 MED ORDER — MOXIFLOXACIN HCL 0.5 % OP SOLN
OPHTHALMIC | Status: DC | PRN
  Administered 2015-11-21: 1 [drp] via OPHTHALMIC

## 2015-11-21 MED ORDER — LIDOCAINE HCL (PF) 4 % IJ SOLN
INTRAOCULAR | Status: DC | PRN
  Administered 2015-11-21: 4 mL via OPHTHALMIC

## 2015-11-21 MED ORDER — NA CHONDROIT SULF-NA HYALURON 40-17 MG/ML IO SOLN
INTRAOCULAR | Status: DC | PRN
  Administered 2015-11-21: 1 mL via INTRAOCULAR

## 2015-11-21 SURGICAL SUPPLY — 21 items
CANNULA ANT/CHMB 27GA (MISCELLANEOUS) ×2 IMPLANT
CUP MEDICINE 2OZ PLAST GRAD ST (MISCELLANEOUS) ×2 IMPLANT
GLOVE BIO SURGEON STRL SZ8 (GLOVE) ×2 IMPLANT
GLOVE BIOGEL M 6.5 STRL (GLOVE) ×2 IMPLANT
GLOVE SURG LX 8.0 MICRO (GLOVE) ×1
GLOVE SURG LX STRL 8.0 MICRO (GLOVE) ×1 IMPLANT
GOWN STRL REUS W/ TWL LRG LVL3 (GOWN DISPOSABLE) ×2 IMPLANT
GOWN STRL REUS W/TWL LRG LVL3 (GOWN DISPOSABLE) ×2
LENS IOL TECNIS ITEC 21.5 (Intraocular Lens) ×2 IMPLANT
PACK CATARACT (MISCELLANEOUS) ×2 IMPLANT
PACK CATARACT BRASINGTON LX (MISCELLANEOUS) ×2 IMPLANT
PACK EYE AFTER SURG (MISCELLANEOUS) ×2 IMPLANT
SOL BSS BAG (MISCELLANEOUS) ×2
SOL PREP PVP 2OZ (MISCELLANEOUS) ×2
SOLUTION BSS BAG (MISCELLANEOUS) ×1 IMPLANT
SOLUTION PREP PVP 2OZ (MISCELLANEOUS) ×1 IMPLANT
SYR 3ML LL SCALE MARK (SYRINGE) ×2 IMPLANT
SYR 5ML LL (SYRINGE) ×2 IMPLANT
SYR TB 1ML 27GX1/2 LL (SYRINGE) ×2 IMPLANT
WATER STERILE IRR 250ML POUR (IV SOLUTION) ×2 IMPLANT
WIPE NON LINTING 3.25X3.25 (MISCELLANEOUS) ×2 IMPLANT

## 2015-11-21 NOTE — H&P (Signed)
All labs reviewed. Abnormal studies sent to patients PCP when indicated.  Previous H&P reviewed, patient examined, there are NO CHANGES.  Georga Stys LOUIS10/10/20179:24 AM

## 2015-11-21 NOTE — Transfer of Care (Signed)
Immediate Anesthesia Transfer of Care Note  Patient: Alicia [name]  Procedure(s) Performed: Procedure(s) with comments: CATARACT EXTRACTION PHACO AND INTRAOCULAR LENS PLACEMENT (IOC) (Left) - US 51.8 AP% 19.4 CDE 10.02 Fluid pack lot # [id number]  Patient Location: PACU  Anesthesia Type:MAC  Level of Consciousness: awake, alert , oriented and patient cooperative  Airway & Oxygen Therapy: Patient Spontanous Breathing  Post-op Assessment: Report given to RN, Post -op Vital signs reviewed and stable and Patient moving all extremities X 4  Post vital signs: Reviewed and stable  Last Vitals:  Vitals:   11/21/15 0811  BP: (!) 171/71  Pulse: (!) 53  Resp: 16  Temp: 36.3 C    Last Pain:  Vitals:   11/21/15 0811  TempSrc: Tympanic         Complications: No apparent anesthesia complications

## 2015-11-21 NOTE — Anesthesia Postprocedure Evaluation (Signed)
Anesthesia Post Note  Patient: Drake LeachWilma B Zapata  Procedure(s) Performed: Procedure(s) (LRB): CATARACT EXTRACTION PHACO AND INTRAOCULAR LENS PLACEMENT (IOC) (Left)  Patient location during evaluation: PACU Anesthesia Type: MAC Level of consciousness: awake and alert, patient cooperative and oriented Pain management: satisfactory to patient Vital Signs Assessment: post-procedure vital signs reviewed and stable Respiratory status: respiratory function stable Cardiovascular status: stable Anesthetic complications: no    Last Vitals:  Vitals:   11/21/15 0811  BP: (!) 171/71  Pulse: (!) 53  Resp: 16  Temp: 36.3 C    Last Pain:  Vitals:   11/21/15 0811  TempSrc: Tympanic                 Michaele OfferSavage,  Khristi Schiller A

## 2015-11-21 NOTE — Anesthesia Preprocedure Evaluation (Addendum)
Anesthesia Evaluation  Patient identified by MRN, date of birth, ID band Patient awake    Reviewed: Allergy & Precautions, NPO status , Patient's Chart, lab work & pertinent test results  History of Anesthesia Complications Negative for: history of anesthetic complications  Airway Mallampati: II  TM Distance: >3 FB Neck ROM: Full    Dental  (+) Poor Dentition   Pulmonary neg sleep apnea, neg COPD, former smoker,    breath sounds clear to auscultation- rhonchi (-) wheezing      Cardiovascular hypertension, Pt. on medications (-) CAD and (-) Past MI  Rhythm:Regular Rate:Normal - Systolic murmurs and - Diastolic murmurs    Neuro/Psych Dementia  negative psych ROS   GI/Hepatic Neg liver ROS, GERD  ,  Endo/Other  neg diabetesHypothyroidism   Renal/GU negative Renal ROS     Musculoskeletal negative musculoskeletal ROS (+)   Abdominal (+) - obese,   Peds  Hematology  (+) anemia ,   Anesthesia Other Findings   Reproductive/Obstetrics                             Anesthesia Physical Anesthesia Plan  ASA: III  Anesthesia Plan: MAC   Post-op Pain Management:    Induction: Intravenous  Airway Management Planned: Natural Airway  Additional Equipment:   Intra-op Plan:   Post-operative Plan:   Informed Consent: I have reviewed the patients History and Physical, chart, labs and discussed the procedure including the risks, benefits and alternatives for the proposed anesthesia with the patient or authorized representative who has indicated his/her understanding and acceptance.   Dental advisory given  Plan Discussed with: CRNA and Anesthesiologist  Anesthesia Plan Comments:        Anesthesia Quick Evaluation

## 2015-11-21 NOTE — Discharge Instructions (Signed)
Eye Surgery Discharge Instructions  Expect mild scratchy sensation or mild soreness. DO NOT RUB YOUR EYE!  The day of surgery:  Minimal physical activity, but bed rest is not required  No reading, computer work, or close hand work  No bending, lifting, or straining.  May watch TV  For 24 hours:  No driving, legal decisions, or alcoholic beverages  Safety precautions  Eat anything you prefer: It is better to start with liquids, then soup then solid foods.  _____ Eye patch should be worn until postoperative exam tomorrow.  ____ Solar shield eyeglasses should be worn for comfort in the sunlight/patch while sleeping  Resume all regular medications including aspirin or Coumadin if these were discontinued prior to surgery. You may shower, bathe, shave, or wash your hair. Tylenol may be taken for mild discomfort.  Call your doctor if you experience significant pain, nausea, or vomiting, fever > 101 or other signs of infection. [phone] or [phone] Specific instructions:  Follow-up Information    Carlena BjornstadPORFILIO,WILLIAM LOUIS, MD .   Specialty:  Ophthalmology Why:  October 11 at 10:45am Contact information: [address] [place] [id number] [phone]

## 2015-11-21 NOTE — Anesthesia Postprocedure Evaluation (Signed)
Anesthesia Post Note  Patient: [name]  Procedure(s) Performed: Procedure(s) (LRB): CATARACT EXTRACTION PHACO AND INTRAOCULAR LENS PLACEMENT (IOC) (Left)  Patient location during evaluation: PACU Anesthesia Type: MAC Level of consciousness: awake and alert Pain management: pain level controlled Vital Signs Assessment: post-procedure vital signs reviewed and stable Respiratory status: spontaneous breathing, nonlabored ventilation, respiratory function stable and patient connected to nasal cannula oxygen Cardiovascular status: stable and blood pressure returned to baseline Anesthetic complications: no    Last Vitals:  Vitals:   11/21/15 0952 11/21/15 0955  BP: (!) 160/91 (!) 187/93  Pulse: (!) 51   Resp: 16   Temp: 36.4 C     Last Pain:  Vitals:   11/21/15 0811  TempSrc: Tympanic                 Jasen Hartstein

## 2015-11-21 NOTE — Op Note (Signed)
PREOPERATIVE DIAGNOSIS:  Nuclear sclerotic cataract of the left eye.   POSTOPERATIVE DIAGNOSIS:  Nuclear sclerotic cataract of the left eye.   OPERATIVE PROCEDURE: Procedure(s): CATARACT EXTRACTION PHACO AND INTRAOCULAR LENS PLACEMENT (IOC)   SURGEON:  Galen ManilaWilliam Tuere Nwosu, MD.   ANESTHESIA:  Anesthesiologist: Alver FisherAmy Penwarden, MD CRNA: Michaele OfferKasey Savage, CRNA  1.      Managed anesthesia care. 2.      Topical tetracaine drops followed by 2% Xylocaine jelly applied in the preoperative holding area.   COMPLICATIONS:  None.   TECHNIQUE:   Stop and chop   DESCRIPTION OF PROCEDURE:  The patient was examined and consented in the preoperative holding area where the aforementioned topical anesthesia was applied to the left eye and then brought back to the Operating Room where the left eye was prepped and draped in the usual sterile ophthalmic fashion and a lid speculum was placed. A paracentesis was created with the side port blade and the anterior chamber was filled with viscoelastic. A near clear corneal incision was performed with the steel keratome. A continuous curvilinear capsulorrhexis was performed with a cystotome followed by the capsulorrhexis forceps. Hydrodissection and hydrodelineation were carried out with BSS on a blunt cannula. The lens was removed in a stop and chop  technique and the remaining cortical material was removed with the irrigation-aspiration handpiece. The capsular bag was inflated with viscoelastic and the Technis [id number] lens was placed in the capsular bag without complication. The remaining viscoelastic was removed from the eye with the irrigation-aspiration handpiece. The wounds were hydrated. The anterior chamber was flushed with Miostat and the eye was inflated to physiologic pressure. 0.2 mL of Vigamox diluted three/one with BSS was placed in the anterior chamber. The wounds were found to be water tight. The eye was dressed with Vigamox. The patient was given protective glasses to  wear throughout the day and a shield with which to sleep tonight. The patient was also given drops with which to begin a drop regimen today and will follow-up with me in one day.  Implant Name Type Inv. Item Serial No. Manufacturer Lot No. LRB No. Used  LENS IOL DIOP 21.5 - [id number] Intraocular Lens LENS IOL DIOP 21.5 [id number] AMO   Left 1    Procedure(s) with comments: CATARACT EXTRACTION PHACO AND INTRAOCULAR LENS PLACEMENT (IOC) (Left) - US 51.8 AP% 19.4 CDE 10.02 Fluid pack lot # [id number] H  Electronically signed: Suhaas Agena LOUIS 11/21/2015 9:50 AM

## 2016-01-09 ENCOUNTER — Other Ambulatory Visit: Payer: Self-pay | Admitting: Physician Assistant

## 2016-01-09 ENCOUNTER — Ambulatory Visit
Admission: RE | Admit: 2016-01-09 | Discharge: 2016-01-09 | Disposition: A | Discharge status: Home / Self Care | Payer: Medicare Other | Source: Ambulatory Visit | Attending: Physician Assistant | Admitting: Physician Assistant

## 2016-01-09 DIAGNOSIS — R600 Localized edema: Secondary | ICD-10-CM | POA: Insufficient documentation

## 2016-03-01 ENCOUNTER — Emergency Department: Payer: Medicare Other

## 2016-03-01 ENCOUNTER — Emergency Department
Admission: EM | Admit: 2016-03-01 | Discharge: 2016-03-01 | Disposition: A | Discharge status: Home / Self Care | Payer: Medicare Other | Attending: Emergency Medicine | Admitting: Emergency Medicine

## 2016-03-01 ENCOUNTER — Encounter: Payer: Self-pay | Admitting: Emergency Medicine

## 2016-03-01 DIAGNOSIS — Z87891 Personal history of nicotine dependence: Secondary | ICD-10-CM | POA: Diagnosis not present

## 2016-03-01 DIAGNOSIS — E039 Hypothyroidism, unspecified: Secondary | ICD-10-CM | POA: Insufficient documentation

## 2016-03-01 DIAGNOSIS — Y939 Activity, unspecified: Secondary | ICD-10-CM | POA: Insufficient documentation

## 2016-03-01 DIAGNOSIS — S6991XA Unspecified injury of right wrist, hand and finger(s), initial encounter: Secondary | ICD-10-CM | POA: Diagnosis present

## 2016-03-01 DIAGNOSIS — S60221A Contusion of right hand, initial encounter: Secondary | ICD-10-CM | POA: Diagnosis not present

## 2016-03-01 DIAGNOSIS — M19041 Primary osteoarthritis, right hand: Secondary | ICD-10-CM

## 2016-03-01 DIAGNOSIS — W07XXXA Fall from chair, initial encounter: Secondary | ICD-10-CM | POA: Diagnosis not present

## 2016-03-01 DIAGNOSIS — Z79899 Other long term (current) drug therapy: Secondary | ICD-10-CM | POA: Insufficient documentation

## 2016-03-01 DIAGNOSIS — Y92129 Unspecified place in nursing home as the place of occurrence of the external cause: Secondary | ICD-10-CM | POA: Insufficient documentation

## 2016-03-01 DIAGNOSIS — I10 Essential (primary) hypertension: Secondary | ICD-10-CM | POA: Insufficient documentation

## 2016-03-01 DIAGNOSIS — Y999 Unspecified external cause status: Secondary | ICD-10-CM | POA: Insufficient documentation

## 2016-03-01 DIAGNOSIS — Z7982 Long term (current) use of aspirin: Secondary | ICD-10-CM | POA: Diagnosis not present

## 2016-03-01 NOTE — ED Notes (Signed)
Pt with ecchymosis and swelling noted to right index finger and thumb. Cms intact to all fingers. Pt states she was sitting down in wheelchair when she lost her balance and fell onto her right hand. Pt denies other injuries, but does have a history of dementia.

## 2016-03-01 NOTE — ED Provider Notes (Signed)
[facility] Emergency Department Provider Note ____________________________________________  Time seen: 2230  I have reviewed the triage vital signs and the nursing notes.  HISTORY  Chief Complaint  Hand Pain  HPI Alicia [name] is a 83 y.o. female visits to the ED accompanied by her adult son for evaluation of injury sustained to the right hand while at her nursing home. She presents from [facility], for evaluation of swelling and bruising to the right thumb and index finger knuckle. It is reported that the patient fell while standing from her wheelchair and bruised her hand. She had delayed onset of swelling and bruising to the thumb. No reported head injury, laceration, or deformity.  Past Medical History:  Diagnosis Date  . Anemia   . Dementia   . Edema   . Fall    6/17 FX RIBS/ HEAD INJURY  . GERD (gastroesophageal reflux disease)   . High cholesterol   . Hypertension   . Low blood potassium   . Thyroid disease    hypothyroidism    Patient Active Problem List   Diagnosis Date Noted  . Lethargy 07/28/2015  . Chest pain 07/24/2015  . Dementia 07/24/2015  . Hypothyroidism 07/24/2015  . Fall 07/24/2015  . HTN (hypertension) 07/24/2015    Past Surgical History:  Procedure Laterality Date  . ABDOMINAL HYSTERECTOMY    . CATARACT EXTRACTION W/PHACO Left 11/21/2015   Procedure: CATARACT EXTRACTION PHACO AND INTRAOCULAR LENS PLACEMENT (IOC);  Surgeon: Galen Manila, MD;  Location: [facility] ORS;  Service: Ophthalmology;  Laterality: Left;  [place] 51.8 AP% 19.4 CDE 10.02 Fluid pack lot # [id number]  . SHOULDER SURGERY Right     Prior to Admission medications   Medication Sig Start Date End Date Taking? Authorizing Provider  acetaminophen (TYLENOL) 325 MG tablet Take 2 tablets (650 mg total) by mouth every 6 (six) hours as needed for mild pain (or Fever >/= 101). 07/31/15   Auburn Bilberry, MD  aspirin EC 81 MG tablet Take 81 mg by mouth daily.     Historical Provider, MD  cyanocobalamin 500 MCG tablet Take 500 mcg by mouth daily.    Historical Provider, MD  divalproex (DEPAKOTE SPRINKLE) 125 MG capsule Take 250 mg by mouth 2 (two) times daily.    Historical Provider, MD  donepezil (ARICEPT) 10 MG tablet Take 10 mg by mouth at bedtime.     Historical Provider, MD  ferrous sulfate 325 (65 FE) MG EC tablet Take 325 mg by mouth daily with breakfast.     Historical Provider, MD  levothyroxine (SYNTHROID, LEVOTHROID) 75 MCG tablet Take 75 mcg by mouth daily before breakfast.     Historical Provider, MD  meloxicam (MOBIC) 7.5 MG tablet Take 7.5 mg by mouth daily.     Historical Provider, MD  memantine (NAMENDA) 10 MG tablet Take 10 mg by mouth 2 (two) times daily.     Historical Provider, MD  mirabegron ER (MYRBETRIQ) 25 MG TB24 tablet Take 25 mg by mouth daily.    Historical Provider, MD  pantoprazole (PROTONIX) 40 MG tablet Take 40 mg by mouth daily before breakfast.     Historical Provider, MD  potassium chloride SA (K-DUR,KLOR-CON) 20 MEQ tablet Take 20 mEq by mouth daily.     Historical Provider, MD  QUEtiapine (SEROQUEL) 25 MG tablet Take 50 mg by mouth at bedtime.     Historical Provider, MD  sertraline (ZOLOFT) 50 MG tablet Take 75 mg by mouth daily.     Historical  Provider, MD  simvastatin (ZOCOR) 20 MG tablet Take 20 mg by mouth at bedtime.     Historical Provider, MD    Allergies Patient has no known allergies.  Family History  Problem Relation Age of Onset  . Breast cancer Sister 5660    Social History Social History  Substance Use Topics  . Smoking status: Former Games developermoker  . Smokeless tobacco: Never Used  . Alcohol use No    Review of Systems  Constitutional: Negative for fever. Musculoskeletal: Negative for back pain. Right hand pain, swelling, and bruising as above.  Skin: Negative for rash. Neurological: Negative for headaches, focal weakness or numbness. ____________________________________________  PHYSICAL  EXAM:  VITAL SIGNS: ED Triage Vitals  Enc Vitals Group     BP 03/01/16 2141 (!) 183/78     Pulse Rate 03/01/16 2141 69     Resp 03/01/16 2141 18     Temp 03/01/16 2141 98 F (36.7 C)     Temp Source 03/01/16 2141 Oral     SpO2 03/01/16 2141 98 %     Weight 03/01/16 2143 154 lb (69.9 kg)     Height 03/01/16 2143 5' (1.524 m)     Head Circumference --      Peak Flow --      Pain Score --      Pain Loc --      Pain Edu? --      Excl. in GC? --    Constitutional: Alert and oriented. Well appearing and in no distress. Head: Normocephalic and atraumatic. Cardiovascular:  Normal distal pulses. Respiratory: Normal respiratory effort.  Musculoskeletal: Right hand with obvious moderate swelling and bruising to the volar aspect of the thumb. There is also some focal swelling and early ecchymosis to the dorsal second MCP. Patient with a normal composite fist on the right. Nontender with normal range of motion in all extremities.  Neurologic: Normal gross sensation. Normal intrinsic and opposition testing.. Normal speech and language. No gross focal neurologic deficits are appreciated. Skin:  Skin is warm, dry and intact. No rash noted. Psychiatric: Mood and affect are normal. Patient is pleasantly, mildly demented on exam.  ____________________________________________   RADIOLOGY  Right Hand FINDINGS: Chronic fracture deformity of the distal radius with mild volar tilt. No definite acute fracture is identified. Mild-to-moderate diffuse interphalangeal osteoarthrosis and severe basal joint osteoarthrosis with productive changes. Bones are demineralized.  I, [name], [name], personally viewed and evaluated these images (plain radiographs) as part of my medical decision making, as well as reviewing the written report by the radiologist. ____________________________________________  PROCEDURES  Ace bandage right thumb ____________________________________________  INITIAL  IMPRESSION / ASSESSMENT AND PLAN / ED COURSE  Patient with symptoms consistent with a right thumb contusion with soft tissue swelling and bruising noted. Symptoms are likely, given by the patient's underlying osteoarthritis and bone spurs. She is advised to follow-up with the primary care provider or see Dr. Ernest PineHooten for ongoing symptoms. Patient is placed in Ace bandage for comfort and support. To dose her home medications for pain as needed.    ____________________________________________  FINAL CLINICAL IMPRESSION(S) / ED DIAGNOSES  Final diagnoses:  Contusion of right hand, initial encounter  Primary osteoarthritis of right hand      Lissa HoardJenise V Bacon Brixton Schnapp, PA-C 03/01/16 2248    Arnaldo NatalPaul F Malinda, MD 03/04/16 [phone]

## 2016-03-01 NOTE — ED Triage Notes (Signed)
Pt was getting out of wheelchair per family when she injured her right hand. Pt has bruising and pain to right thumb and index finger, family states happened around 1600 today. Pt is able to move all finger, pt has a history of dementia.

## 2016-03-01 NOTE — Discharge Instructions (Signed)
Your exam and hand x-rays may show some arthritis with a small avulsion (chip) of the bone spurs. The treatment is otherwise like that for a sprained finger/thumb. Wear the ace bandage for comfort and support. Apply ice to reduce swelling. The bruising may take several weeks to finally resolve. Follow-up with Dr. Ernest PineHooten as needed. Give Tylenol 3 times a day as needed for pain.

## 2016-03-01 NOTE — ED Notes (Signed)
Jenese, pa applied thumb spica.

## 2016-07-29 ENCOUNTER — Other Ambulatory Visit: Payer: Self-pay | Admitting: Internal Medicine

## 2016-07-29 ENCOUNTER — Ambulatory Visit
Admission: RE | Admit: 2016-07-29 | Discharge: 2016-07-29 | Disposition: A | Discharge status: Home / Self Care | Payer: Medicare Other | Source: Ambulatory Visit | Attending: Internal Medicine | Admitting: Internal Medicine

## 2016-07-29 DIAGNOSIS — R600 Localized edema: Secondary | ICD-10-CM | POA: Diagnosis present

## 2016-07-30 ENCOUNTER — Other Ambulatory Visit: Payer: Self-pay | Admitting: Internal Medicine

## 2016-09-24 IMAGING — CT CT CERVICAL SPINE W/O CM
3 of 6 series · 15 of 33 positions shown, 17 images · non-contrast
Comparison: Head CT 07/28/2015, cervical spine CT 04/11/2014.

CLINICAL DATA: 85-year-old female status post fall at [REDACTED]. Forehead hematoma. Initial encounter.

EXAM:
CT HEAD WITHOUT CONTRAST
CT CERVICAL SPINE WITHOUT CONTRAST
TECHNIQUE: Multidetector CT imaging of the head and cervical spine was
performed following the standard protocol without intravenous
contrast. Multiplanar CT image reconstructions of the cervical spine
were also generated.

[Series 4: coronal soft tissue · coronal · 0.28mm/px · 3 of 59 slices shown]
[im 15/59  bone]
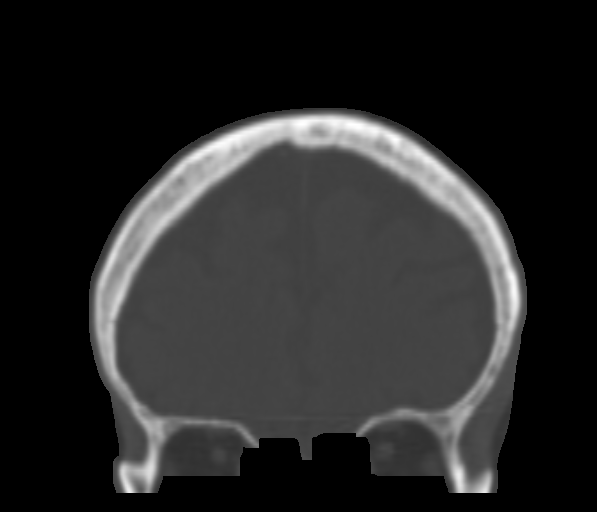
[im 30/59  bone]
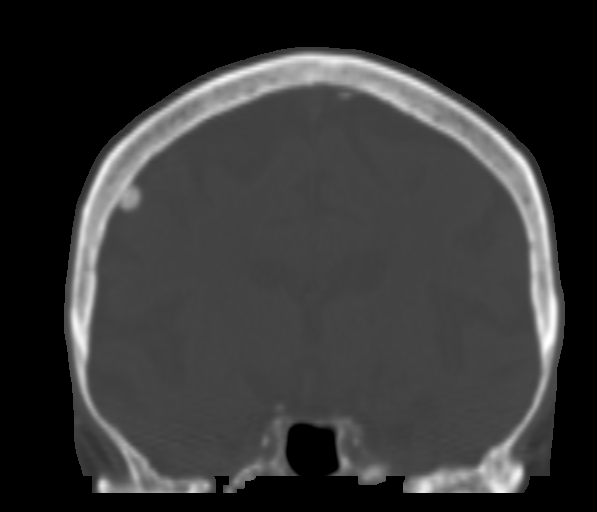
[im 44/59  bone]
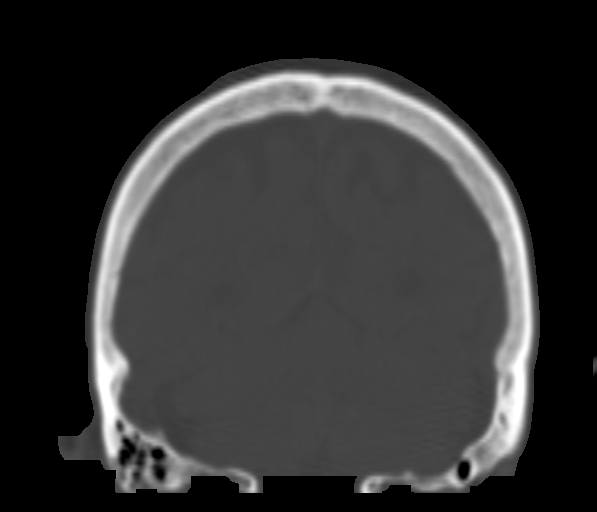

[Series 7: soft tissue · axial · 0.29mm/px · z∈[-187,-59]mm · 8 of 82 slices shown, 10 images]
[im 9/82  soft-tissue]
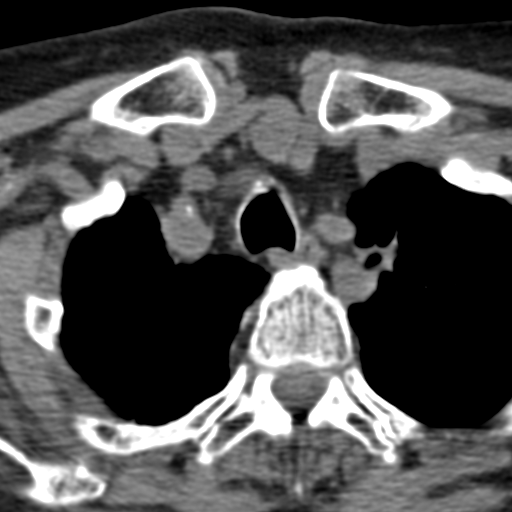
[im 9/82  bone]
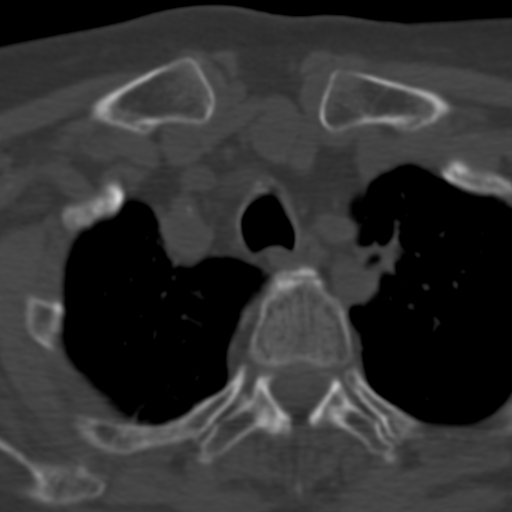
[im 17/82  bone]
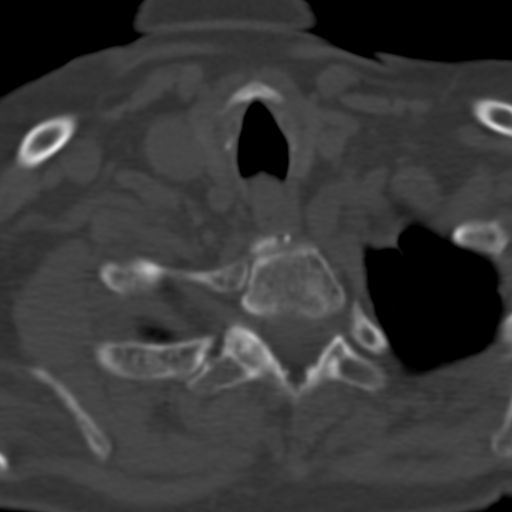
[im 25/82  bone]
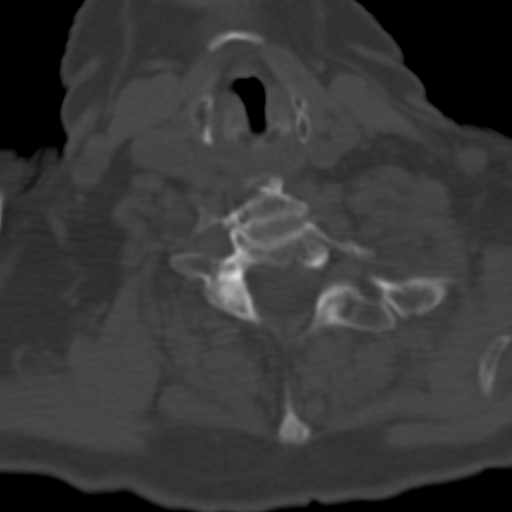
[im 33/82  bone]
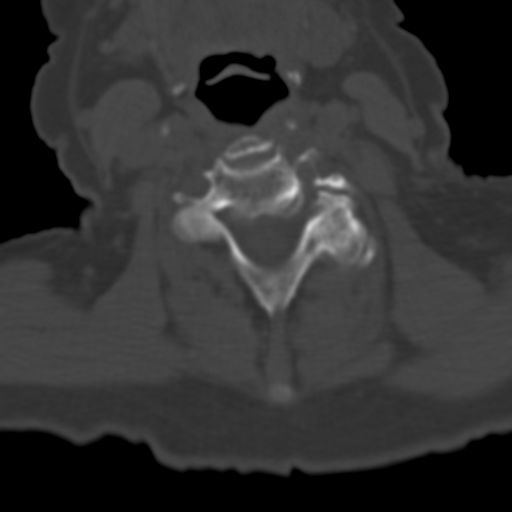
[im 49/82  soft-tissue]
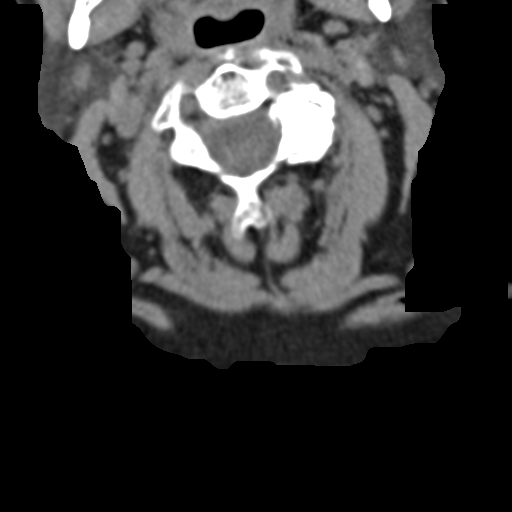
[im 49/82  bone]
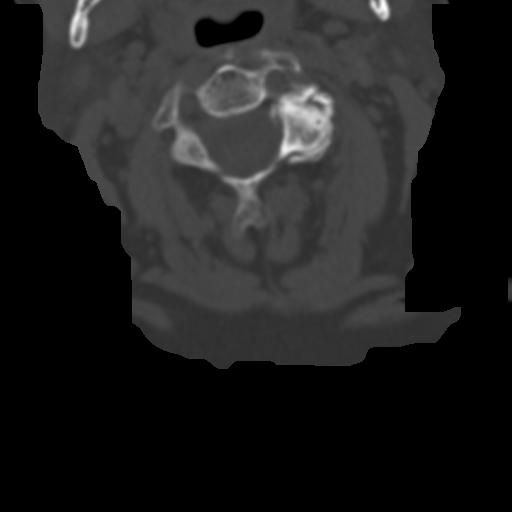
[im 57/82  bone]
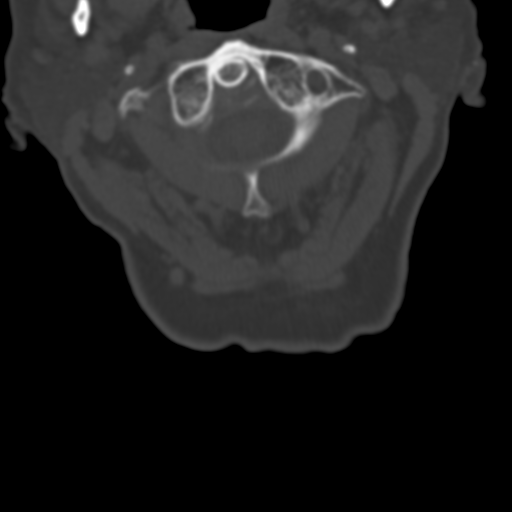
[im 65/82  bone]
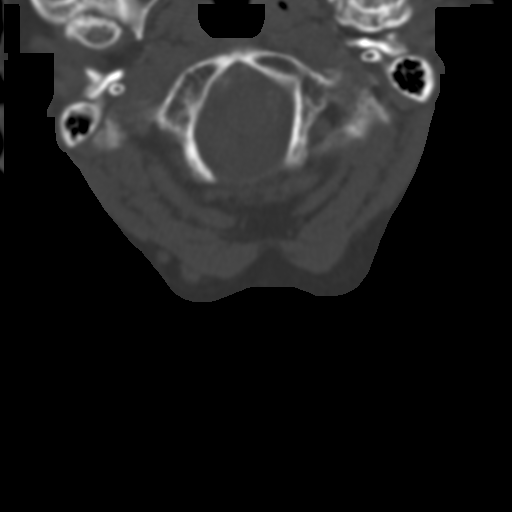
[im 73/82  bone]
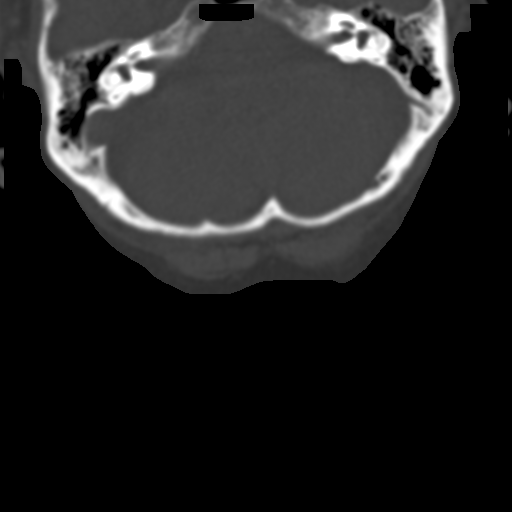

[Series 8: sagittal bone · sagittal · 0.24mm/px · 4 of 38 slices shown]
[im 8/38  bone]
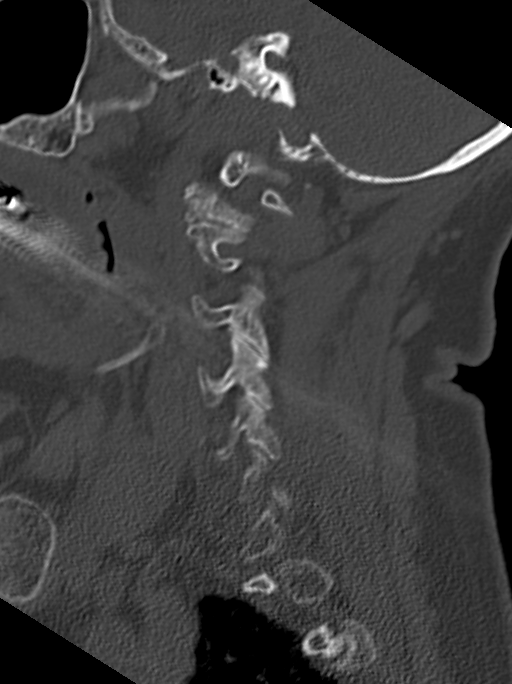
[im 15/38  bone]
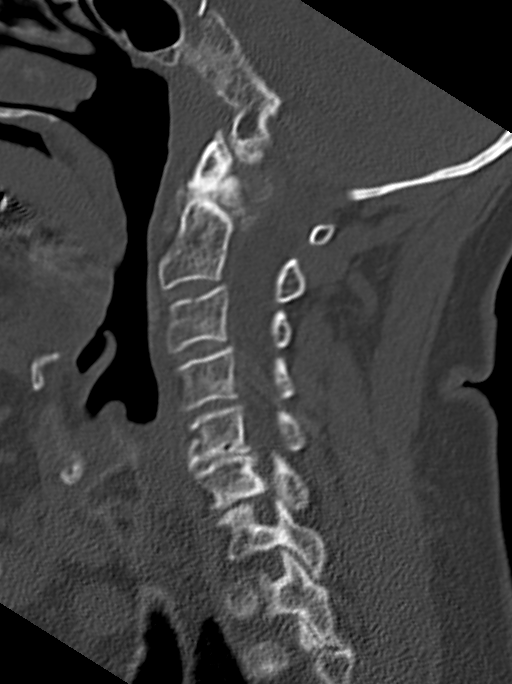
[im 23/38  bone]
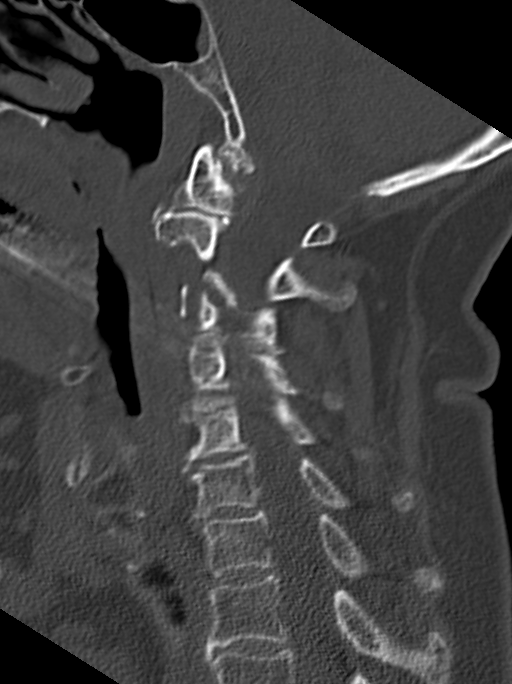
[im 30/38  bone]
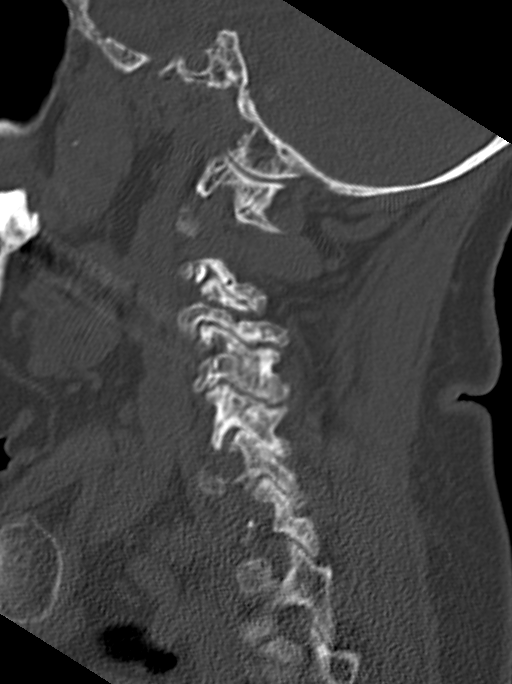

[15 of 33 positions shown; findings below may reference images not displayed]

FINDINGS: CT HEAD FINDINGS

Severe degenerative changes at the left TMJ. Visualized paranasal
sinuses and mastoids are stable and well pneumatized. Broad-based
left anterior convexity scalp hematoma measuring up to 8 mm in
thickness. Underlying left frontal bone intact. Calvarium appears
stable and intact. Negative visualized orbits soft tissues.
Calcified atherosclerosis at the skull base.

Stable cerebral volume. Small right lateral convexity dural
calcification or calcified meningioma, appears inconsequential. No
intracranial mass effect. No ventriculomegaly. Stable gray-white
matter differentiation throughout the brain. No cortically based
acute infarct identified. No acute intracranial hemorrhage
identified.

CT CERVICAL SPINE FINDINGS

Chronic severe partially calcified ligamentous hypertrophy about the
odontoid. Chronic straightening of cervical lordosis with multilevel
mild anterolisthesis. Visualized skull base is intact. No
atlanto-occipital dissociation. Diffuse severe left side cervical
facet degeneration. Stable posterior element alignment.
Cervicothoracic junction alignment is within normal limits. No acute
cervical spine fracture identified. Grossly intact visualized upper
thoracic levels.

Stable visualized lung apices. Calcified aortic and carotid artery
atherosclerosis. Negative noncontrast neck soft tissues.
IMPRESSION: 1. Left forehead scalp hematoma without underlying skull fracture.
2.  Stable non contrast CT appearance of the brain.
3. No acute fracture or listhesis identified in the cervical spine.
Ligamentous injury is not excluded.
4. Advanced odontoid and left cervical facet degeneration.

## 2016-10-22 ENCOUNTER — Inpatient Hospital Stay
Admission: EM | Admit: 2016-10-22 | Discharge: 2016-10-24 | DRG: 871 | Disposition: A | Discharge status: Other Acute Inpatient Hospital | Payer: Medicare Other | Attending: Internal Medicine | Admitting: Internal Medicine

## 2016-10-22 ENCOUNTER — Encounter: Payer: Self-pay | Admitting: *Deleted

## 2016-10-22 ENCOUNTER — Emergency Department: Payer: Medicare Other

## 2016-10-22 DIAGNOSIS — E876 Hypokalemia: Secondary | ICD-10-CM | POA: Diagnosis present

## 2016-10-22 DIAGNOSIS — I10 Essential (primary) hypertension: Secondary | ICD-10-CM | POA: Diagnosis present

## 2016-10-22 DIAGNOSIS — Z66 Do not resuscitate: Secondary | ICD-10-CM | POA: Diagnosis present

## 2016-10-22 DIAGNOSIS — Z7982 Long term (current) use of aspirin: Secondary | ICD-10-CM | POA: Diagnosis not present

## 2016-10-22 DIAGNOSIS — N3000 Acute cystitis without hematuria: Secondary | ICD-10-CM | POA: Diagnosis present

## 2016-10-22 DIAGNOSIS — Z90710 Acquired absence of both cervix and uterus: Secondary | ICD-10-CM | POA: Diagnosis not present

## 2016-10-22 DIAGNOSIS — G9341 Metabolic encephalopathy: Secondary | ICD-10-CM | POA: Diagnosis present

## 2016-10-22 DIAGNOSIS — F329 Major depressive disorder, single episode, unspecified: Secondary | ICD-10-CM | POA: Diagnosis present

## 2016-10-22 DIAGNOSIS — E039 Hypothyroidism, unspecified: Secondary | ICD-10-CM | POA: Diagnosis present

## 2016-10-22 DIAGNOSIS — K219 Gastro-esophageal reflux disease without esophagitis: Secondary | ICD-10-CM | POA: Diagnosis present

## 2016-10-22 DIAGNOSIS — E7800 Pure hypercholesterolemia, unspecified: Secondary | ICD-10-CM | POA: Diagnosis present

## 2016-10-22 DIAGNOSIS — N390 Urinary tract infection, site not specified: Secondary | ICD-10-CM | POA: Diagnosis present

## 2016-10-22 DIAGNOSIS — A419 Sepsis, unspecified organism: Principal | ICD-10-CM | POA: Diagnosis present

## 2016-10-22 DIAGNOSIS — Z87891 Personal history of nicotine dependence: Secondary | ICD-10-CM

## 2016-10-22 DIAGNOSIS — E785 Hyperlipidemia, unspecified: Secondary | ICD-10-CM | POA: Diagnosis present

## 2016-10-22 DIAGNOSIS — Z993 Dependence on wheelchair: Secondary | ICD-10-CM | POA: Diagnosis not present

## 2016-10-22 DIAGNOSIS — F0390 Unspecified dementia without behavioral disturbance: Secondary | ICD-10-CM | POA: Diagnosis present

## 2016-10-22 LAB — COMPREHENSIVE METABOLIC PANEL
ALBUMIN: 3.2 g/dL — AB (ref 3.5–5.0)
ALT: 44 U/L (ref 14–54)
ANION GAP: 11 (ref 5–15)
AST: 46 U/L — ABNORMAL HIGH (ref 15–41)
Alkaline Phosphatase: 114 U/L (ref 38–126)
BILIRUBIN TOTAL: 0.7 mg/dL (ref 0.3–1.2)
BUN: 19 mg/dL (ref 6–20)
CO2: 30 mmol/L (ref 22–32)
Calcium: 9.1 mg/dL (ref 8.9–10.3)
Chloride: 99 mmol/L — ABNORMAL LOW (ref 101–111)
Creatinine, Ser: 1.01 mg/dL — ABNORMAL HIGH (ref 0.44–1.00)
GFR calc Af Amer: 58 mL/min — ABNORMAL LOW (ref >60)
GFR calc non Af Amer: 50 mL/min — ABNORMAL LOW (ref >60)
GLUCOSE: 153 mg/dL — AB (ref 65–99)
POTASSIUM: 3.1 mmol/L — AB (ref 3.5–5.1)
Sodium: 140 mmol/L (ref 135–145)
TOTAL PROTEIN: 7.6 g/dL (ref 6.5–8.1)

## 2016-10-22 LAB — URINALYSIS, COMPLETE (UACMP) WITH MICROSCOPIC
BACTERIA UA: NONE SEEN
BILIRUBIN URINE: NEGATIVE
GLUCOSE, UA: NEGATIVE mg/dL
KETONES UR: NEGATIVE mg/dL
LEUKOCYTES UA: NEGATIVE
NITRITE: NEGATIVE
PROTEIN: NEGATIVE mg/dL
Specific Gravity, Urine: 1.015 (ref 1.005–1.030)
pH: 5 (ref 5.0–8.0)

## 2016-10-22 LAB — CBC
HEMATOCRIT: 37.4 % (ref 35.0–47.0)
HEMOGLOBIN: 12.7 g/dL (ref 12.0–16.0)
MCH: 28.7 pg (ref 26.0–34.0)
MCHC: 34 g/dL (ref 32.0–36.0)
MCV: 84.4 fL (ref 80.0–100.0)
Platelets: 414 10*3/uL (ref 150–440)
RBC: 4.42 MIL/uL (ref 3.80–5.20)
RDW: 13.6 % (ref 11.5–14.5)
WBC: 16 10*3/uL — ABNORMAL HIGH (ref 3.6–11.0)

## 2016-10-22 LAB — MRSA PCR SCREENING: MRSA by PCR: NEGATIVE

## 2016-10-22 LAB — LACTIC ACID, PLASMA
LACTIC ACID, VENOUS: 1.6 mmol/L (ref 0.5–1.9)
Lactic Acid, Venous: 2.2 mmol/L (ref 0.5–1.9)

## 2016-10-22 MED ORDER — DIVALPROEX SODIUM 125 MG PO CSDR
250.0000 mg | DELAYED_RELEASE_CAPSULE | Freq: Two times a day (BID) | ORAL | Status: DC
  Administered 2016-10-22 – 2016-10-24 (×4): 250 mg via ORAL
  Filled 2016-10-22 (×5): qty 2

## 2016-10-22 MED ORDER — SODIUM CHLORIDE 0.9 % IV SOLN
INTRAVENOUS | Status: AC
  Administered 2016-10-22 – 2016-10-23 (×2): via INTRAVENOUS

## 2016-10-22 MED ORDER — DOCUSATE SODIUM 100 MG PO CAPS: 100.0000 mg | ORAL_CAPSULE | Freq: Two times a day (BID) | ORAL | Status: DC | PRN

## 2016-10-22 MED ORDER — SERTRALINE HCL 50 MG PO TABS
150.0000 mg | ORAL_TABLET | Freq: Every day | ORAL | Status: DC
  Administered 2016-10-22 – 2016-10-24 (×3): 150 mg via ORAL
  Filled 2016-10-22 (×3): qty 3

## 2016-10-22 MED ORDER — SIMVASTATIN 20 MG PO TABS
20.0000 mg | ORAL_TABLET | Freq: Every day | ORAL | Status: DC
  Administered 2016-10-23: 20 mg via ORAL
  Filled 2016-10-22 (×2): qty 1

## 2016-10-22 MED ORDER — MEMANTINE HCL 5 MG PO TABS
10.0000 mg | ORAL_TABLET | Freq: Two times a day (BID) | ORAL | Status: DC
  Administered 2016-10-22 – 2016-10-24 (×4): 10 mg via ORAL
  Filled 2016-10-22 (×5): qty 2

## 2016-10-22 MED ORDER — POTASSIUM CHLORIDE CRYS ER 20 MEQ PO TBCR
40.0000 meq | EXTENDED_RELEASE_TABLET | Freq: Once | ORAL | Status: AC
  Administered 2016-10-22: 40 meq via ORAL
  Filled 2016-10-22: qty 2

## 2016-10-22 MED ORDER — ONDANSETRON HCL 4 MG/2ML IJ SOLN: 4.0000 mg | Freq: Four times a day (QID) | INTRAMUSCULAR | Status: DC | PRN

## 2016-10-22 MED ORDER — FAMOTIDINE 20 MG PO TABS
20.0000 mg | ORAL_TABLET | Freq: Every evening | ORAL | Status: DC
  Administered 2016-10-22 – 2016-10-23 (×2): 20 mg via ORAL
  Filled 2016-10-22 (×2): qty 1

## 2016-10-22 MED ORDER — SODIUM CHLORIDE 0.9 % IV SOLN
1.0000 g | Freq: Two times a day (BID) | INTRAVENOUS | Status: DC
  Administered 2016-10-22 – 2016-10-24 (×4): 1 g via INTRAVENOUS
  Filled 2016-10-22 (×7): qty 1

## 2016-10-22 MED ORDER — ASPIRIN EC 81 MG PO TBEC
81.0000 mg | DELAYED_RELEASE_TABLET | Freq: Every day | ORAL | Status: DC
  Administered 2016-10-22 – 2016-10-24 (×3): 81 mg via ORAL
  Filled 2016-10-22 (×3): qty 1

## 2016-10-22 MED ORDER — ACETAMINOPHEN 325 MG PO TABS: 650.0000 mg | ORAL_TABLET | Freq: Four times a day (QID) | ORAL | Status: DC | PRN

## 2016-10-22 MED ORDER — FERROUS SULFATE 325 (65 FE) MG PO TABS
325.0000 mg | ORAL_TABLET | Freq: Every day | ORAL | Status: DC
Start: 2016-10-23 — End: 2016-10-24
  Administered 2016-10-23 – 2016-10-24 (×2): 325 mg via ORAL
  Filled 2016-10-22 (×2): qty 1

## 2016-10-22 MED ORDER — VANCOMYCIN HCL IN DEXTROSE 1-5 GM/200ML-% IV SOLN
1000.0000 mg | Freq: Once | INTRAVENOUS | Status: AC
  Administered 2016-10-22: 1000 mg via INTRAVENOUS
  Filled 2016-10-22: qty 200

## 2016-10-22 MED ORDER — MIRABEGRON ER 25 MG PO TB24
25.0000 mg | ORAL_TABLET | Freq: Every day | ORAL | Status: DC
  Administered 2016-10-23 – 2016-10-24 (×2): 25 mg via ORAL
  Filled 2016-10-22 (×2): qty 1

## 2016-10-22 MED ORDER — ONDANSETRON HCL 4 MG PO TABS: 4.0000 mg | ORAL_TABLET | Freq: Four times a day (QID) | ORAL | Status: DC | PRN

## 2016-10-22 MED ORDER — ALPRAZOLAM 0.25 MG PO TABS
0.2500 mg | ORAL_TABLET | Freq: Every evening | ORAL | Status: DC | PRN
  Administered 2016-10-22: 0.25 mg via ORAL
  Filled 2016-10-22: qty 1

## 2016-10-22 MED ORDER — ENOXAPARIN SODIUM 40 MG/0.4ML ~~LOC~~ SOLN
40.0000 mg | SUBCUTANEOUS | Status: DC
  Administered 2016-10-22 – 2016-10-23 (×2): 40 mg via SUBCUTANEOUS
  Filled 2016-10-22 (×2): qty 0.4

## 2016-10-22 MED ORDER — DEXTROSE 5 % IV SOLN
2.0000 g | Freq: Once | INTRAVENOUS | Status: AC
  Administered 2016-10-22: 2 g via INTRAVENOUS
  Filled 2016-10-22: qty 2

## 2016-10-22 MED ORDER — LEVOTHYROXINE SODIUM 50 MCG PO TABS
75.0000 ug | ORAL_TABLET | Freq: Every day | ORAL | Status: DC
  Administered 2016-10-23 – 2016-10-24 (×2): 75 ug via ORAL
  Filled 2016-10-22 (×2): qty 2

## 2016-10-22 MED ORDER — DONEPEZIL HCL 5 MG PO TABS
10.0000 mg | ORAL_TABLET | Freq: Every day | ORAL | Status: DC
  Administered 2016-10-23: 10 mg via ORAL
  Filled 2016-10-22 (×3): qty 2

## 2016-10-22 MED ORDER — BISACODYL 5 MG PO TBEC: 5.0000 mg | DELAYED_RELEASE_TABLET | Freq: Every day | ORAL | Status: DC | PRN

## 2016-10-22 MED ORDER — HYDROCODONE-ACETAMINOPHEN 5-325 MG PO TABS
1.0000 | ORAL_TABLET | Freq: Four times a day (QID) | ORAL | Status: DC | PRN
  Administered 2016-10-22 – 2016-10-24 (×2): 1 via ORAL
  Filled 2016-10-22 (×2): qty 1

## 2016-10-22 MED ORDER — QUETIAPINE FUMARATE 25 MG PO TABS
25.0000 mg | ORAL_TABLET | Freq: Every day | ORAL | Status: DC
  Administered 2016-10-22 – 2016-10-23 (×2): 25 mg via ORAL
  Filled 2016-10-22 (×2): qty 1

## 2016-10-22 MED ORDER — VITAMIN B-12 1000 MCG PO TABS
500.0000 ug | ORAL_TABLET | Freq: Every day | ORAL | Status: DC
  Administered 2016-10-23 – 2016-10-24 (×2): 500 ug via ORAL
  Filled 2016-10-22 (×2): qty 1

## 2016-10-22 NOTE — ED Triage Notes (Signed)
[facility], pt was recently treated for UTI with keflex but states low fever of 99.3, confusion and flank pain, brought over for concerns of sepsis

## 2016-10-22 NOTE — ED Provider Notes (Signed)
[facility] Emergency Department Provider Note   ____________________________________________    I have reviewed the triage vital signs and the nursing notes.   HISTORY  Chief Complaint Flank Pain and Altered Mental Status  History limited by dementia   HPI Alicia [name] is a 83 y.o. female Who presents with complaints ofaltered mental status, fever and possibly some flank pain. Patient reportedly was treated for urinary tract infection last week, she has finished her antibiotics. Son reports over the last 4-5 days she has become more confused, and has spent much of her day sleeping. Reportedly fevers today at nursing home memory care unit. there've been some complaints of left-sided flank pain, unable to characterize.no vomiting   Past Medical History:  Diagnosis Date  . Anemia   . Dementia   . Edema   . Fall    6/17 FX RIBS/ HEAD INJURY  . GERD (gastroesophageal reflux disease)   . High cholesterol   . Hypertension   . Low blood potassium   . Thyroid disease    hypothyroidism    Patient Active Problem List   Diagnosis Date Noted  . Lethargy 07/28/2015  . Chest pain 07/24/2015  . Dementia 07/24/2015  . Hypothyroidism 07/24/2015  . Fall 07/24/2015  . HTN (hypertension) 07/24/2015    Past Surgical History:  Procedure Laterality Date  . ABDOMINAL HYSTERECTOMY    . CATARACT EXTRACTION W/PHACO Left 11/21/2015   Procedure: CATARACT EXTRACTION PHACO AND INTRAOCULAR LENS PLACEMENT (IOC);  Surgeon: Galen ManilaWilliam Porfilio, MD;  Location: [facility] ORS;  Service: Ophthalmology;  Laterality: Left;  [place] 51.8 AP% 19.4 CDE 10.02 Fluid pack lot # [id number]  . SHOULDER SURGERY Right     Prior to Admission medications   Medication Sig Start Date End Date Taking? Authorizing Provider  acetaminophen (TYLENOL) 325 MG tablet Take 2 tablets (650 mg total) by mouth every 6 (six) hours as needed for mild pain (or Fever >/= 101). 07/31/15  Yes Auburn BilberryPatel, Shreyang, MD    ALPRAZolam Prudy Feeler(XANAX) 0.25 MG tablet Take 0.25 mg by mouth at bedtime as needed for anxiety.   Yes [provider]  aspirin EC 81 MG tablet Take 81 mg by mouth daily.   Yes [provider]  bisacodyl (DULCOLAX) 5 MG EC tablet Take 5 mg by mouth daily as needed for moderate constipation.   Yes [provider]  cyanocobalamin 500 MCG tablet Take 500 mcg by mouth daily.   Yes [provider]  divalproex (DEPAKOTE SPRINKLE) 125 MG capsule Take 250 mg by mouth 2 (two) times daily.   Yes [provider]  docusate sodium (COLACE) 100 MG capsule Take 100 mg by mouth 2 (two) times daily as needed for mild constipation.   Yes [provider]  donepezil (ARICEPT) 10 MG tablet Take 10 mg by mouth at bedtime.    Yes [provider]  ferrous sulfate 325 (65 FE) MG EC tablet Take 325 mg by mouth daily with breakfast.    Yes [provider]  furosemide (LASIX) 20 MG tablet Take 20 mg by mouth daily. 07/29/16  Yes [provider]  HYDROcodone-acetaminophen (NORCO/VICODIN) 5-325 MG tablet Take 1 tablet by mouth every 6 (six) hours as needed for moderate pain.   Yes [provider]  levothyroxine (SYNTHROID, LEVOTHROID) 75 MCG tablet Take 75 mcg by mouth daily before breakfast.    Yes [provider]  memantine (NAMENDA) 10 MG tablet Take 10 mg by mouth 2 (two) times daily.  Yes [provider]  mirabegron ER (MYRBETRIQ) 25 MG TB24 tablet Take 25 mg by mouth daily.   Yes [provider]  ondansetron (ZOFRAN) 4 MG tablet Take 4 mg by mouth every 6 (six) hours as needed for nausea.   Yes [provider]  QUEtiapine (SEROQUEL) 25 MG tablet Take 25 mg by mouth at bedtime.    Yes [provider]  ranitidine (ZANTAC) 150 MG tablet Take 150 mg by mouth 2 (two) times daily. 08/28/16  Yes [provider]  sertraline (ZOLOFT) 50 MG tablet Take 150 mg by mouth daily.    Yes [provider]  simvastatin (ZOCOR) 20 MG tablet Take 20 mg by mouth at bedtime.    Yes [provider]     Allergies Patient has no known allergies.  Family History  Problem Relation Age of Onset  . Breast cancer Sister 96    Social History Social History  Substance Use Topics  . Smoking status: Former Games developer  . Smokeless tobacco: Never Used  . Alcohol use No    Review of Systems  Constitutional: No fever/chills Eyes: No visual changes.  ENT: No sore throat. Cardiovascular: Denies chest pain. Respiratory: Denies shortness of breath. Gastrointestinal: No abdominal pain.  No nausea, no vomiting.   Genitourinary: left-sided flank pain Musculoskeletal: Negative for back pain. Skin: Negative for rash. Neurological: Negative for headaches   ____________________________________________   PHYSICAL EXAM:  VITAL SIGNS: ED Triage Vitals  Enc Vitals Group     BP 10/22/16 1112 (!) 144/73     Pulse Rate 10/22/16 1112 75     Resp 10/22/16 1112 18     Temp 10/22/16 1112 100.2 F (37.9 C)     Temp Source 10/22/16 1112 Oral     SpO2 10/22/16 1112 96 %     Weight 10/22/16 1113 72.6 kg (160 lb)     Height 10/22/16 1113 1.524 m (5')     Head Circumference --      Peak Flow --      Pain Score --      Pain Loc --      Pain Edu? --      Excl. in GC? --     Constitutional: Alert. No acute distress.  Eyes: Conjunctivae are normal.  Head: Atraumatic. Nose: No congestion/rhinnorhea.  Cardiovascular: Normal rate, regular rhythm. Grossly normal heart sounds.  Good peripheral circulation. Respiratory: Normal respiratory effort.  No retractions. Lungs CTAB. Gastrointestinal: Soft and nontender. No distention.  Mild left CVA tenderness Genitourinary: deferred Musculoskeletal:   Warm and well perfused Neurologic:  Normal speech and language. No gross focal neurologic deficits are appreciated.  Skin:  Skin is warm, dry and intact. No rash noted. Psychiatric: Mood and  affect are normal. Speech and behavior are normal.  ____________________________________________   LABS (all labs ordered are listed, but only abnormal results are displayed)  Labs Reviewed  COMPREHENSIVE METABOLIC PANEL - Abnormal; Notable for the following:       Result Value   Potassium 3.1 (*)    Chloride 99 (*)    Glucose, Bld 153 (*)    Creatinine, Ser 1.01 (*)    Albumin 3.2 (*)    AST 46 (*)    GFR calc non Af Amer 50 (*)    GFR calc Af Amer 58 (*)    All other components within normal limits  CBC - Abnormal; Notable for the following:    WBC 16.0 (*)    All  other components within normal limits  URINALYSIS, COMPLETE (UACMP) WITH MICROSCOPIC - Abnormal; Notable for the following:    Color, Urine YELLOW (*)    APPearance HAZY (*)    Hgb urine dipstick MODERATE (*)    Squamous Epithelial / LPF 0-5 (*)    All other components within normal limits  LACTIC ACID, PLASMA - Abnormal; Notable for the following:    Lactic Acid, Venous 2.2 (*)    All other components within normal limits  CULTURE, BLOOD (ROUTINE X 2)  CULTURE, BLOOD (ROUTINE X 2)  URINE CULTURE  LACTIC ACID, PLASMA  URINALYSIS, ROUTINE W REFLEX MICROSCOPIC  CBG MONITORING, ED   ____________________________________________  EKG  None ____________________________________________  RADIOLOGY  CT renal stone study pending Chest x-ray unremarkable ____________________________________________   PROCEDURES  Procedure(s) performed: No    Critical Care performed:No ____________________________________________   INITIAL IMPRESSION / ASSESSMENT AND PLAN / ED COURSE  Pertinent labs & imaging results that were available during my care of the patient were reviewed by me and considered in my medical decision making (see chart for details).  Patient well-appearing in no acute distress however she idoes have an elevated temperature and reports a fever at nursing care facility. Suspect urinary tract  infection, labs pending, IV fluids and antibiotics.  Patient with elevated lactic and wbc. Rocephin IV and admission to the hospitalist service   ____________________________________________   FINAL CLINICAL IMPRESSION(S) / ED DIAGNOSES  Final diagnoses:  Sepsis, due to unspecified organism [facility])      NEW MEDICATIONS STARTED DURING THIS VISIT:  New Prescriptions   No medications on file     Note:  This document was prepared using Dragon voice recognition software and may include unintentional dictation errors.    Jene Every, MD 10/22/16 1339

## 2016-10-22 NOTE — H&P (Signed)
[facility] - [place] [facility]   PATIENT NAME: Alicia [name]    MR#:  [id number]  DATE OF BIRTH:  August 30, 1930  DATE OF ADMISSION:  10/22/2016  PRIMARY CARE PHYSICIAN: Barbette Reichmann, MD   REQUESTING/REFERRING PHYSICIAN: Dr. Jene Every  CHIEF COMPLAINT:   Chief Complaint  Patient presents with  . Flank Pain  . Altered Mental Status    HISTORY OF PRESENT ILLNESS:  Alicia [name]  is a 83 y.o. female with a known history of dementia, hypertension, hyperlipidemia, hypothyroidism and GERD who is from memory care unit at home place is brought in secondary to altered mental status. -Patient is unable to provide any history due to her underlying dementia. Her son who is her healthcare power of attorney, was present at bedside and able to provide most of the information. At baseline patient is wheelchair bound mostly. She is alert and conversational but not oriented. She needs assistance with daily activities including feeding. Since yesterday she has been extremely weak and sleeping most of the time. Highest temperature was 100.8F. She's been on Keflex for 3 days for urinary tract infection. Urine analysis here does not show any bacteria. But patient has a white count of 16 with fevers She is being admitted for early sepsis. No nausea or vomiting. Patient complains of lower abdominal pain associated with right flank pain.  PAST MEDICAL HISTORY:   Past Medical History:  Diagnosis Date  . Anemia   . Dementia   . Edema   . Fall    6/17 FX RIBS/ HEAD INJURY  . GERD (gastroesophageal reflux disease)   . High cholesterol   . Hypertension   . Low blood potassium   . Thyroid disease    hypothyroidism    PAST SURGICAL HISTORY:   Past Surgical History:  Procedure Laterality Date  . ABDOMINAL HYSTERECTOMY    . CATARACT EXTRACTION W/PHACO Left 11/21/2015   Procedure: CATARACT EXTRACTION PHACO AND INTRAOCULAR LENS PLACEMENT (IOC);  Surgeon: Galen Manila, MD;   Location: [facility] ORS;  Service: Ophthalmology;  Laterality: Left;  [place] 51.8 AP% 19.4 CDE 10.02 Fluid pack lot # [id number]  . SHOULDER SURGERY Right     SOCIAL HISTORY:   Social History  Substance Use Topics  . Smoking status: Former Games developer  . Smokeless tobacco: Never Used     Comment: quit >40 years ago  . Alcohol use No    FAMILY HISTORY:   Family History  Problem Relation Age of Onset  . Breast cancer Sister 36    DRUG ALLERGIES:  No Known Allergies  REVIEW OF SYSTEMS:   Review of Systems  Unable to perform ROS: Dementia    MEDICATIONS AT HOME:   Prior to Admission medications   Medication Sig Start Date End Date Taking? Authorizing Provider  acetaminophen (TYLENOL) 325 MG tablet Take 2 tablets (650 mg total) by mouth every 6 (six) hours as needed for mild pain (or Fever >/= 101). 07/31/15  Yes Auburn Bilberry, MD  ALPRAZolam Prudy Feeler) 0.25 MG tablet Take 0.25 mg by mouth at bedtime as needed for anxiety.   Yes [provider]  aspirin EC 81 MG tablet Take 81 mg by mouth daily.   Yes [provider]  bisacodyl (DULCOLAX) 5 MG EC tablet Take 5 mg by mouth daily as needed for moderate constipation.   Yes [provider]  cyanocobalamin 500 MCG tablet Take 500 mcg by mouth daily.   Yes [provider]  divalproex (DEPAKOTE SPRINKLE) 125  MG capsule Take 250 mg by mouth 2 (two) times daily.   Yes [provider]  docusate sodium (COLACE) 100 MG capsule Take 100 mg by mouth 2 (two) times daily as needed for mild constipation.   Yes [provider]  donepezil (ARICEPT) 10 MG tablet Take 10 mg by mouth at bedtime.    Yes [provider]  ferrous sulfate 325 (65 FE) MG EC tablet Take 325 mg by mouth daily with breakfast.    Yes [provider]  furosemide (LASIX) 20 MG tablet Take 20 mg by mouth daily. 07/29/16  Yes [provider]  HYDROcodone-acetaminophen (NORCO/VICODIN) 5-325 MG tablet Take 1 tablet  by mouth every 6 (six) hours as needed for moderate pain.   Yes [provider]  levothyroxine (SYNTHROID, LEVOTHROID) 75 MCG tablet Take 75 mcg by mouth daily before breakfast.    Yes [provider]  memantine (NAMENDA) 10 MG tablet Take 10 mg by mouth 2 (two) times daily.    Yes [provider]  mirabegron ER (MYRBETRIQ) 25 MG TB24 tablet Take 25 mg by mouth daily.   Yes [provider]  ondansetron (ZOFRAN) 4 MG tablet Take 4 mg by mouth every 6 (six) hours as needed for nausea.   Yes [provider]  QUEtiapine (SEROQUEL) 25 MG tablet Take 25 mg by mouth at bedtime.    Yes [provider]  ranitidine (ZANTAC) 150 MG tablet Take 150 mg by mouth 2 (two) times daily. 08/28/16  Yes [provider]  sertraline (ZOLOFT) 50 MG tablet Take 150 mg by mouth daily.    Yes [provider]  simvastatin (ZOCOR) 20 MG tablet Take 20 mg by mouth at bedtime.    Yes [provider]      VITAL SIGNS:  Blood pressure (!) 149/63, pulse 74, temperature 100.2 F (37.9 C), temperature source Oral, resp. rate 18, height 5' (1.524 m), weight 72.6 kg (160 lb), SpO2 97 %.  PHYSICAL EXAMINATION:   Physical Exam  GENERAL:  83 y.o.-year-old elderly patient lying in the bed with no acute distress.  EYES: Pupils equal, round, reactive to light and accommodation. No scleral icterus. Extraocular muscles intact.  HEENT: Head atraumatic, normocephalic. Oropharynx and nasopharynx clear.  NECK:  Supple, no jugular venous distention. No thyroid enlargement, no tenderness.  LUNGS: Normal breath sounds bilaterally, no wheezing, rales,rhonchi or crepitation. No use of accessory muscles of respiration. Decreased bibasilar breath sounds CARDIOVASCULAR: S1, S2 normal. No  rubs, or gallops. 2/6 systolic murmur present ABDOMEN: Soft, nontender, nondistended. Bowel sounds present. No organomegaly or mass.  EXTREMITIES: No pedal edema, cyanosis, or  clubbing.  NEUROLOGIC: Cranial nerves II through XII are intact. Muscle strength 4/5 in all extremities. Sensation intact. Follows some simple commands PSYCHIATRIC: The patient is alert but disoriented.Marland Kitchen  SKIN: No obvious rash, lesion, or ulcer.   LABORATORY PANEL:   CBC  Recent Labs Lab 10/22/16 1128  WBC 16.0*  HGB 12.7  HCT 37.4  PLT 414   ------------------------------------------------------------------------------------------------------------------  Chemistries   Recent Labs Lab 10/22/16 1128  NA 140  K 3.1*  CL 99*  CO2 30  GLUCOSE 153*  BUN 19  CREATININE 1.01*  CALCIUM 9.1  AST 46*  ALT 44  ALKPHOS 114  BILITOT 0.7   ------------------------------------------------------------------------------------------------------------------  Cardiac Enzymes No results for input(s): TROPONINI in the last 168 hours. ------------------------------------------------------------------------------------------------------------------  RADIOLOGY:  Dg Chest Port 1 View  Result Date: 10/22/2016 CLINICAL DATA:  Flank pain.  Fever. EXAM:  PORTABLE CHEST 1 VIEW COMPARISON:  09/12/2015 .  07/28/2015.  10/23/2013 . FINDINGS: Slight increased prominence in the superior mediastinum. No midline shift. Findings are most likely related prominent great vessels. PA lateral chest x-ray suggested for further evaluation. No focal pulmonary infiltrate. Pleural-parenchymal thickening on the left noted consistent with scarring. Small left pleural effusion cannot be excluded. Old left rib fractures. IMPRESSION: 1. Slight increased prominence of the superior mediastinum. No midline shift. Findings are most likely related to prominent great vessels. PA lateral chest x-ray suggest for further evaluation. 2. Pleural-parenchymal thickening on the left noted most consistent scarring. Small left pleural effusion cannot be excluded. Electronically Signed   By: Maisie Fushomas  Register   On: 10/22/2016 12:11   Ct  Renal Stone Study  Result Date: 10/22/2016 CLINICAL DATA:  Flank pain.  Suspect stone disease. EXAM: CT ABDOMEN AND PELVIS WITHOUT CONTRAST TECHNIQUE: Multidetector CT imaging of the abdomen and pelvis was performed following the standard protocol without IV contrast. COMPARISON:  09/09/2012 FINDINGS: Lower chest: The lung bases appear clear. No pleural or pericardial effusion identified. Hepatobiliary: No focal liver abnormality. No gallstones, gallbladder wall thickening or biliary dilatation. Pancreas: Unremarkable. No pancreatic ductal dilatation or surrounding inflammatory changes. Spleen: Normal in size without focal abnormality. Adrenals/Urinary Tract: Unremarkable appearance of the adrenal glands. Bilateral renal cortical scarring noted. Large parenchymal calcification within the upper pole of the left kidney is unchanged measuring 1.5 cm, image 34 of series 2. Indeterminate hyperdense lesion arising from the lower pole of left kidney measures 2.3 cm and 57 HU, image 46 of series 2. No renal stones or hydronephrosis identified. Stomach/Bowel: The stomach is normal. The small bowel loops have a normal course and caliber. No obstruction. Colonic diverticula Noted without acute inflammation. Vascular/Lymphatic: Aortic atherosclerosis. No aneurysm. No upper abdominal or pelvic adenopathy. No inguinal adenopathy. Reproductive: Status post hysterectomy. No adnexal masses. Other: No abdominal wall hernia or abnormality. No abdominopelvic ascites. Musculoskeletal: Degenerative disc disease is noted throughout the lumbar spine. Most advanced at L5-S1. IMPRESSION: 1. No acute findings within the abdomen or pelvis. No nephrolithiasis or hydronephrosis. 2. Hyperdense lesion arising from the inferior pole of the left kidney is new from previous exam. This may represent solid neoplasm versus hemorrhagic cyst. Further investigation with contrast enhanced MRI or CT is advised. 3. Similar appearance of large parenchymal  calcification within the upper pole of left kidney. 4.  Aortic Atherosclerosis (ICD10-I70.0). Electronically Signed   By: Signa Kellaylor  Stroud M.D.   On: 10/22/2016 12:55    EKG:   Orders placed or performed during the hospital encounter of 09/12/15  . EKG 12-Lead  . EKG 12-Lead    IMPRESSION AND PLAN:   Alicia [name]  is a 83 y.o. female with a known history of dementia, hypertension, hyperlipidemia, hypothyroidism and GERD who is from memory care unit at home place is brought in secondary to altered mental status.  #1 Early sepsis-with fever and elevated white cell count - blood and urine cultures are sent for. Likely source could be urinary tract infection with pyelonephritis -CT of the abdomen does not show any renal abscess. He does have new left inferior renal pole hyperdense lesion. Outpatient MRI of the abdomen advised. -Started on meropenem until urine culture results are available.  #2 altered mental status-metabolic encephalopathy from underlying infection. -Gentle hydration. Continue IV antibiotics.  -no focal deficits. We'll hold off on CT head at this time.  #3hypothyroidism-continue Synthroid  #4 dementia/depression-continue Namenda, Aricept. Also on Depakote, Seroquel and Zoloft.  #  5 hypokalemia-being replaced  #6 DVT prophylaxis-on Lovenox   Physical Therapy consulted.    All the records are reviewed and case discussed with ED provider. Management plans discussed with the patient, family and they are in agreement.  CODE STATUS: DNR- discussed with son at bedside  TOTAL TIME TAKING CARE OF THIS PATIENT: 50 minutes.    Enid Baas M.D on 10/22/2016 at 2:04 PM  Between 7am to 6pm - Pager - [phone]  After 6pm go to [email or web address] - Social research officer, government  [facility]  Office  [phone]  CC: Primary care physician; Barbette Reichmann, MD

## 2016-10-22 NOTE — ED Triage Notes (Signed)
Pt from memory care unit at [facility]

## 2016-10-22 NOTE — ED Notes (Signed)
Pt was in and out cath'd for urine sample - pt tolerated well - assisted by [place]az[place]Jordan RN and Countrywide FinancialMike Medic Tech

## 2016-10-22 NOTE — Progress Notes (Signed)
Pharmacy Antibiotic Note  Alicia LeachWilma B [name] is a 83 y.o. female admitted on 10/22/2016 with sepsis.  Pharmacy has been consulted for merrem dosing.  Plan: merrem 1gm iv q12h   Height: 5' (152.4 cm) Weight: 160 lb (72.6 kg) IBW/kg (Calculated) : 45.5  Temp (24hrs), Avg:100.2 F (37.9 C), Min:100.2 F (37.9 C), Max:100.2 F (37.9 C)   Recent Labs Lab 10/22/16 1128 10/22/16 1148  WBC 16.0*  --   CREATININE 1.01*  --   LATICACIDVEN  --  2.2*    Estimated Creatinine Clearance: 36.9 mL/min (A) (by C-G formula based on SCr of 1.01 mg/dL (H)).    No Known Allergies  Antimicrobials this admission: 9/11 merrem >>  9/11 vancomycin 1gm x 1  9/11 ceftriaxone >>    Microbiology results: 9/11 BCx: x2 9/11 UCx:   Thank you for allowing pharmacy to be a part of this patient's care.  Gerre PebblesGarrett Oliwia [name] 10/22/2016 2:18 PM

## 2016-10-22 NOTE — ED Notes (Signed)
Elevated lactic of 2.2 reported to Dr Cyril LoosenKinner - see new atb orders

## 2016-10-23 LAB — CBC
HCT: 31.6 % — ABNORMAL LOW (ref 35.0–47.0)
Hemoglobin: 11.3 g/dL — ABNORMAL LOW (ref 12.0–16.0)
MCH: 30.6 pg (ref 26.0–34.0)
MCHC: 35.7 g/dL (ref 32.0–36.0)
MCV: 85.7 fL (ref 80.0–100.0)
Platelets: 327 10*3/uL (ref 150–440)
RBC: 3.69 MIL/uL — ABNORMAL LOW (ref 3.80–5.20)
RDW: 13.4 % (ref 11.5–14.5)
WBC: 10.9 10*3/uL (ref 3.6–11.0)

## 2016-10-23 LAB — BASIC METABOLIC PANEL
Anion gap: 9 (ref 5–15)
BUN: 18 mg/dL (ref 6–20)
CALCIUM: 8.5 mg/dL — AB (ref 8.9–10.3)
CO2: 28 mmol/L (ref 22–32)
CREATININE: 0.84 mg/dL (ref 0.44–1.00)
Chloride: 106 mmol/L (ref 101–111)
Glucose, Bld: 119 mg/dL — ABNORMAL HIGH (ref 65–99)
Potassium: 3.1 mmol/L — ABNORMAL LOW (ref 3.5–5.1)
SODIUM: 143 mmol/L (ref 135–145)

## 2016-10-23 LAB — URINE CULTURE: Culture: NO GROWTH

## 2016-10-23 MED ORDER — SODIUM CHLORIDE 0.9 % IV SOLN
INTRAVENOUS | Status: DC
  Administered 2016-10-24: 02:00:00 via INTRAVENOUS

## 2016-10-23 MED ORDER — POTASSIUM CHLORIDE CRYS ER 20 MEQ PO TBCR
40.0000 meq | EXTENDED_RELEASE_TABLET | Freq: Once | ORAL | Status: AC
  Administered 2016-10-23: 40 meq via ORAL
  Filled 2016-10-23: qty 2

## 2016-10-23 NOTE — Clinical Social Work Note (Signed)
Clinical Social Work Assessment  Patient Details  Name: Alicia LeachWilma B [name] MRN: [id number] Date of Birth: 12/21/1930  Date of referral:  10/23/16               Reason for consult:                   Permission sought to share information with:    Permission granted to share information::     Name::        Agency::     Relationship::     Contact Information:     Housing/Transportation Living arrangements for the past 2 months:  Assisted Living Facility (assisted living memory care unit) Source of Information:  Facility Patient Interpreter Needed:  None Criminal Activity/Legal Involvement Pertinent to Current Situation/Hospitalization:  No - Comment as needed Significant Relationships:  Adult Children Lives with:  Facility Resident Do you feel safe going back to the place where you live?    Need for family participation in patient care:  Yes (Comment)  Care giving concerns: [facility]   Social Worker assessment / plan:  CSW attempted to contact patient's son: Leory Plowmanllen Boon: [phone]: [phone] but had to leave a message. CSW [facility] with [facility] and she stated that patient is not ambulatory at baseline and that they will take patient back at discharge.   Employment status:  Retired Database administratornsurance information:  Managed Medicare PT Recommendations:  Home with Home Health Information / Referral to community resources:     Patient/Family's Response to care:  [facility] appreciation for CSW involvement.   Patient/Family's Understanding of and Emotional Response to Diagnosis, Current Treatment, and Prognosis:  [facility] will accept patient back at discharge.  Emotional Assessment Appearance:  Appears stated age Attitude/Demeanor/Rapport:    Affect (typically observed):    Orientation:  Oriented to Self Alcohol / Substance use:  Not Applicable Psych involvement (Current and /or in the community):  No (Comment)  Discharge Needs   Concerns to be addressed:  Care Coordination Readmission within the last 30 days:  No Current discharge risk:  None Barriers to Discharge:  No Barriers Identified   York SpanielMonica Keshonda Monsour, LCSW 10/23/2016, 2:00 PM

## 2016-10-23 NOTE — Evaluation (Signed)
Physical Therapy Evaluation Patient Details Name: Alicia [name] MRN: [id number] DOB: 11/06/1930 Today's Date: 10/23/2016   History of Present Illness  Pt admitted for complaints of AMS and flank pain. Pt now admitted for early sepsis and acute cystitis. History includes dementia, HTN, and GERD. Per chart review, pt from [facility] memory care unit.  Clinical Impression  Pt is a pleasant 83 year old female who was admitted for sepsis and acute cysitis. Pt is currently confused and only oriented to person. Poor historian, history obtained from chart. Pt performs bed mobility with max assist able to sit at EOB briefly ( a minute), then fatigues and requires to be returned back to bed with +2 assist. Heavy posterior leaning noted with poor balance while sitting at EOB. Pt demonstrates deficits with strength/balance/mobility. Pt is high risk for falls. Would benefit from skilled PT to address above deficits and promote optimal return to PLOF. Recommend to return back to [facility] ALF with trial run of HHPT.      Follow Up Recommendations  (return back to memory care with HHPT trial)    Equipment Recommendations  None recommended by PT    Recommendations for Other Services       Precautions / Restrictions Precautions Precautions: Fall Restrictions Weight Bearing Restrictions: No      Mobility  Bed Mobility Overal bed mobility: Needs Assistance Bed Mobility: Supine to Sit     Supine to sit: Max assist     General bed mobility comments: assist for sliding B LEs off bed. Able to initiate movement, however increased posterior leaning noted while seated at EOB, unable to maintain upright postition. Needs +2 for return back supine.  Transfers                 General transfer comment: unable to perform at this time  Ambulation/Gait                Stairs            Wheelchair Mobility    Modified Rankin (Stroke Patients Only)       Balance Overall balance  assessment: Needs assistance Sitting-balance support: Feet unsupported;Bilateral upper extremity supported Sitting balance-Leahy Scale: Poor                                       Pertinent Vitals/Pain Pain Assessment: Faces Faces Pain Scale: Hurts a little bit Pain Location: R hemibody Pain Descriptors / Indicators: Aching;Discomfort;Dull Pain Intervention(s): Limited activity within patient's tolerance    Home Living Family/patient expects to be discharged to:: Skilled nursing facility                      Prior Function Level of Independence: Needs assistance         Comments: pt is poor historian secondary to dementia. Per chart reivew, pt is wheelchair bound. Pt unable to elaborate at this time. Per chart, needs assist for all ADLs in addition to feeding.     Hand Dominance        Extremity/Trunk Assessment   Upper Extremity Assessment Upper Extremity Assessment: Generalized weakness;Difficult to assess due to impaired cognition (only able to test grip strength 3+/5)    Lower Extremity Assessment Lower Extremity Assessment: Generalized weakness (B LE grossly 4/5 based on functional evaluation)       Communication   Communication: No difficulties  Cognition Arousal/Alertness: Awake/alert Behavior  During Therapy: WFL for tasks assessed/performed Overall Cognitive Status: History of cognitive impairments - at baseline                                        General Comments      Exercises Other Exercises Other Exercises: supine ther-ex performed on B LE including ankle pumps, SLRs, and hip abd/add. Pt able to participate, however does resist on L side appears to be painful with hip movement. R side only requires cga. Safe technique performed x 10 reps.   Assessment/Plan    PT Assessment Patient needs continued PT services  PT Problem List Decreased strength;Decreased balance;Decreased mobility;Decreased cognition        PT Treatment Interventions DME instruction;Therapeutic activities;Therapeutic exercise;Balance training    PT Goals (Current goals can be found in the Care Plan section)  Acute Rehab PT Goals Patient Stated Goal: unable to state goals at this time PT Goal Formulation: Patient unable to participate in goal setting Time For Goal Achievement: 11/06/16 Potential to Achieve Goals: Fair    Frequency Min 2X/week   Barriers to discharge        Co-evaluation               AM-PAC PT "6 Clicks" Daily Activity  Outcome Measure Difficulty turning over in bed (including adjusting bedclothes, sheets and blankets)?: Unable Difficulty moving from lying on back to sitting on the side of the bed? : Unable Difficulty sitting down on and standing up from a chair with arms (e.g., wheelchair, bedside commode, etc,.)?: Unable Help needed moving to and from a bed to chair (including a wheelchair)?: Total Help needed walking in hospital room?: Total Help needed climbing 3-5 steps with a railing? : Total 6 Click Score: 6    End of Session   Activity Tolerance: Patient tolerated treatment well Patient left: in bed;with bed alarm set Nurse Communication: Mobility status PT Visit Diagnosis: Muscle weakness (generalized) (M62.81)    Time: [id number] PT Time Calculation (min) (ACUTE ONLY): 20 min   Charges:   PT Evaluation $PT Eval Low Complexity: 1 Low PT Treatments $Therapeutic Exercise: 8-22 mins   PT G Codes:   PT G-Codes **NOT FOR INPATIENT CLASS** Functional Assessment Tool Used: AM-PAC 6 Clicks Basic Mobility Functional Limitation: Mobility: Walking and moving around Mobility: Walking and Moving Around Current Status (I6962(G8978): 100 percent impaired, limited or restricted Mobility: Walking and Moving Around Goal Status ([id number](G8979): At least 80 percent but less than 100 percent impaired, limited or restricted   Elizabeth PalauStephanie Zoejane Gaulin, PT, DPT [phone]   Irmgard Rampersaud 10/23/2016, 3:44  PM

## 2016-10-23 NOTE — Clinical Social Work Note (Deleted)
CSW met with patient this morning and spoke with her at length regarding the recommendation for STR. Patient is adamant that she is returning home and will not go back to rehab because she just got out of a rehab. CSW implored her to reconsider her decision but she would not. RN CM has been made aware. Shela Leff MSW,LCSW [phone]

## 2016-10-23 NOTE — Progress Notes (Signed)
[facility] - [place] [facility]                                                                                                                                                                                  Patient Demographics   Alicia SpragueWilma [name], is a 83 y.o. female, DOB - 02/06/1931, ZOX:[id number]  Admit date - 10/22/2016   Admitting Physician Enid Baasadhika Kalisetti, MD  Outpatient Primary MD for the patient is Barbette ReichmannHande, Vishwanath, MD   LOS - 1  Subjective: Patient was admitted with confusion    Review of Systems:   CONSTITUTIONAL: unable to provide due to dementia  Vitals:   Vitals:   10/22/16 1749 10/23/16 0553 10/23/16 1401 10/23/16 1403  BP: (!) 158/60 (!) 157/74 (!) 152/122 (!) 140/97  Pulse: 69 (!) 59 90 86  Resp: 16 18    Temp: 98.2 F (36.8 C) (!) 97.5 F (36.4 C) 99.3 F (37.4 C)   TempSrc: Oral Oral Oral   SpO2: 97% 95% 96% 97%  Weight:      Height:        Wt Readings from Last 3 Encounters:  10/22/16 160 lb (72.6 kg)  03/01/16 154 lb (69.9 kg)  11/20/15 136 lb (61.7 kg)     Intake/Output Summary (Last 24 hours) at 10/23/16 1415 Last data filed at 10/23/16 0900  Gross per 24 hour  Intake              830 ml  Output                1 ml  Net              829 ml    Physical Exam:   GENERAL: Pleasant-appearing in no apparent distress.  HEAD, EYES, EARS, NOSE AND THROAT: Atraumatic, normocephalic. Extraocular muscles are intact. Pupils equal and reactive to light. Sclerae anicteric. No conjunctival injection. No oro-pharyngeal erythema.  NECK: Supple. There is no jugular venous distention. No bruits, no lymphadenopathy, no thyromegaly.  HEART: Regular rate and rhythm,. No murmurs, no rubs, no clicks.  LUNGS: Clear to auscultation bilaterally. No rales or rhonchi. No wheezes.  ABDOMEN: Soft, flat, nontender, nondistended. Has good bowel sounds. No hepatosplenomegaly appreciated.  EXTREMITIES: No evidence of any cyanosis, clubbing, or  peripheral edema.  +2 pedal and radial pulses bilaterally.  NEUROLOGIC: Confused SKIN: Moist and warm with no rashes appreciated.  Psych: confused LN: No inguinal LN enlargement    Antibiotics   Anti-infectives    Start     Dose/Rate Route Frequency Ordered Stop   10/22/16 1430  meropenem (MERREM) 1 g in sodium chloride 0.9 % 100 mL IVPB  1 g 200 mL/hr over 30 Minutes Intravenous Every 12 hours 10/22/16 1418     10/22/16 1415  vancomycin (VANCOCIN) IVPB 1000 mg/200 mL premix     1,000 mg 200 mL/hr over 60 Minutes Intravenous  Once 10/22/16 1408 10/22/16 1733   10/22/16 1245  cefTRIAXone (ROCEPHIN) 2 g in dextrose 5 % 50 mL IVPB     2 g 100 mL/hr over 30 Minutes Intravenous  Once 10/22/16 1240 10/22/16 1733      Medications   Scheduled Meds: . aspirin EC  81 mg Oral Daily  . divalproex  250 mg Oral BID  . donepezil  10 mg Oral QHS  . enoxaparin (LOVENOX) injection  40 mg Subcutaneous Q24H  . famotidine  20 mg Oral QPM  . ferrous sulfate  325 mg Oral Q breakfast  . levothyroxine  75 mcg Oral QAC breakfast  . memantine  10 mg Oral BID  . mirabegron ER  25 mg Oral Daily  . QUEtiapine  25 mg Oral QHS  . sertraline  150 mg Oral Daily  . simvastatin  20 mg Oral QHS  . cyanocobalamin  500 mcg Oral Daily   Continuous Infusions: . sodium chloride 60 mL/hr at 10/23/16 1046  . meropenem (MERREM) IV Stopped (10/23/16 1114)   PRN Meds:.acetaminophen, ALPRAZolam, bisacodyl, docusate sodium, HYDROcodone-acetaminophen, ondansetron **OR** ondansetron (ZOFRAN) IV   Data Review:   Micro Results Recent Results (from the past 240 hour(s))  Blood Culture (routine x 2)     Status: None (Preliminary result)   Collection Time: 10/22/16 11:48 AM  Result Value Ref Range Status   Specimen Description BLOOD RIGHT ANTECUBITAL  Final   Special Requests   Final    BOTTLES DRAWN AEROBIC AND ANAEROBIC Blood Culture adequate volume   Culture NO GROWTH < 24 HOURS  Final   Report Status  PENDING  Incomplete  Blood Culture (routine x 2)     Status: None (Preliminary result)   Collection Time: 10/22/16 11:48 AM  Result Value Ref Range Status   Specimen Description BLOOD LEFT ANTECUBITAL  Final   Special Requests   Final    BOTTLES DRAWN AEROBIC AND ANAEROBIC Blood Culture results may not be optimal due to an excessive volume of blood received in culture bottles   Culture NO GROWTH < 24 HOURS  Final   Report Status PENDING  Incomplete  Urine culture     Status: None   Collection Time: 10/22/16 11:48 AM  Result Value Ref Range Status   Specimen Description URINE, RANDOM  Final   Special Requests NONE  Final   Culture   Final    NO GROWTH Performed at [facility], [address], [place], [place] [address]    Report Status 10/23/2016 FINAL  Final  MRSA PCR Screening     Status: None   Collection Time: 10/22/16  6:42 PM  Result Value Ref Range Status   MRSA by PCR NEGATIVE NEGATIVE Final    Comment:        The GeneXpert MRSA Assay (FDA approved for NASAL specimens only), is one component of a comprehensive MRSA colonization surveillance program. It is not intended to diagnose MRSA infection nor to guide or monitor treatment for MRSA infections.     Radiology Reports Dg Chest Port 1 View  Result Date: 10/22/2016 CLINICAL DATA:  Flank pain.  Fever. EXAM: PORTABLE CHEST 1 VIEW COMPARISON:  09/12/2015 .  07/28/2015.  10/23/2013 . FINDINGS: Slight increased prominence in the superior  mediastinum. No midline shift. Findings are most likely related prominent great vessels. PA lateral chest x-ray suggested for further evaluation. No focal pulmonary infiltrate. Pleural-parenchymal thickening on the left noted consistent with scarring. Small left pleural effusion cannot be excluded. Old left rib fractures. IMPRESSION: 1. Slight increased prominence of the superior mediastinum. No midline shift. Findings are most likely related to prominent great vessels. PA lateral  chest x-ray suggest for further evaluation. 2. Pleural-parenchymal thickening on the left noted most consistent scarring. Small left pleural effusion cannot be excluded. Electronically Signed   By: Maisie Fus  Register   On: 10/22/2016 12:11   Ct Renal Stone Study  Result Date: 10/22/2016 CLINICAL DATA:  Flank pain.  Suspect stone disease. EXAM: CT ABDOMEN AND PELVIS WITHOUT CONTRAST TECHNIQUE: Multidetector CT imaging of the abdomen and pelvis was performed following the standard protocol without IV contrast. COMPARISON:  09/09/2012 FINDINGS: Lower chest: The lung bases appear clear. No pleural or pericardial effusion identified. Hepatobiliary: No focal liver abnormality. No gallstones, gallbladder wall thickening or biliary dilatation. Pancreas: Unremarkable. No pancreatic ductal dilatation or surrounding inflammatory changes. Spleen: Normal in size without focal abnormality. Adrenals/Urinary Tract: Unremarkable appearance of the adrenal glands. Bilateral renal cortical scarring noted. Large parenchymal calcification within the upper pole of the left kidney is unchanged measuring 1.5 cm, image 34 of series 2. Indeterminate hyperdense lesion arising from the lower pole of left kidney measures 2.3 cm and 57 HU, image 46 of series 2. No renal stones or hydronephrosis identified. Stomach/Bowel: The stomach is normal. The small bowel loops have a normal course and caliber. No obstruction. Colonic diverticula Noted without acute inflammation. Vascular/Lymphatic: Aortic atherosclerosis. No aneurysm. No upper abdominal or pelvic adenopathy. No inguinal adenopathy. Reproductive: Status post hysterectomy. No adnexal masses. Other: No abdominal wall hernia or abnormality. No abdominopelvic ascites. Musculoskeletal: Degenerative disc disease is noted throughout the lumbar spine. Most advanced at L5-S1. IMPRESSION: 1. No acute findings within the abdomen or pelvis. No nephrolithiasis or hydronephrosis. 2. Hyperdense lesion  arising from the inferior pole of the left kidney is new from previous exam. This may represent solid neoplasm versus hemorrhagic cyst. Further investigation with contrast enhanced MRI or CT is advised. 3. Similar appearance of large parenchymal calcification within the upper pole of left kidney. 4.  Aortic Atherosclerosis (ICD10-I70.0). Electronically Signed   By: Signa Kell M.D.   On: 10/22/2016 12:55     CBC  Recent Labs Lab 10/22/16 1128 10/23/16 0405  WBC 16.0* 10.9  HGB 12.7 11.3*  HCT 37.4 31.6*  PLT 414 327  MCV 84.4 85.7  MCH 28.7 30.6  MCHC 34.0 35.7  RDW 13.6 13.4    Chemistries   Recent Labs Lab 10/22/16 1128 10/23/16 0405  NA 140 143  K 3.1* 3.1*  CL 99* 106  CO2 30 28  GLUCOSE 153* 119*  BUN 19 18  CREATININE 1.01* 0.84  CALCIUM 9.1 8.5*  AST 46*  --   ALT 44  --   ALKPHOS 114  --   BILITOT 0.7  --    ------------------------------------------------------------------------------------------------------------------ estimated creatinine clearance is 44.3 mL/min (by C-G formula based on SCr of 0.84 mg/dL). ------------------------------------------------------------------------------------------------------------------ No results for input(s): HGBA1C in the last 72 hours. ------------------------------------------------------------------------------------------------------------------ No results for input(s): CHOL, HDL, LDLCALC, TRIG, CHOLHDL, LDLDIRECT in the last 72 hours. ------------------------------------------------------------------------------------------------------------------ No results for input(s): TSH, T4TOTAL, T3FREE, THYROIDAB in the last 72 hours.  Invalid input(s): FREET3 ------------------------------------------------------------------------------------------------------------------ No results for input(s): VITAMINB12, FOLATE, FERRITIN, TIBC, IRON, RETICCTPCT in the last 72  hours.  Coagulation profile No results for input(s):  INR, PROTIME in the last 168 hours.  No results for input(s): DDIMER in the last 72 hours.  Cardiac Enzymes No results for input(s): CKMB, TROPONINI, MYOGLOBIN in the last 168 hours.  Invalid input(s): CK ------------------------------------------------------------------------------------------------------------------ Invalid input(s): POCBNP    Assessment & Plan   Lavella Myren  is a 83 y.o. female with a known history of dementia, hypertension, hyperlipidemia, hypothyroidism and GERD who is from [facility] care unit at [facility] is brought in secondary to altered mental status.  #1 Early sepsis-with fever and elevated white cell count Continue IV anabiotic'sfollow culture  #2 altered mental status-metabolic encephalopathy from underlying infection. -Gentle hydration. Continue IV antibiotics.  -appears to have improved has baseline dementia  #3hypothyroidism-continue Synthroid  #4 dementia/depression-continue Namenda, Aricept. Also on Depakote, Seroquel and Zoloft.  #5 hypokalemia-being replaced  #6 DVT prophylaxis-on Lovenox     Code Status Orders        Start     Ordered   10/22/16 1622  Do not attempt resuscitation (DNR)  Continuous    Question Answer Comment  In the event of cardiac or respiratory ARREST Do not call a "code blue"   In the event of cardiac or respiratory ARREST Do not perform Intubation, CPR, defibrillation or ACLS   In the event of cardiac or respiratory ARREST Use medication by any route, position, wound care, and other measures to relive pain and suffering. May use oxygen, suction and manual treatment of airway obstruction as needed for comfort.      10/22/16 1621    Code Status History    Date Active Date Inactive Code Status Order ID Comments User Context   07/28/2015  5:47 PM 07/31/2015  5:56 PM DNR [id number]  Katha Hamming, MD ED   07/24/2015 11:27 PM 07/26/2015  6:26 PM DNR [id number]  Gery Pray, MD Inpatient    Advance  Directive Documentation     Most Recent Value  Type of Advance Directive  Healthcare Power of Attorney, Living will, Out of facility DNR (pink MOST or yellow form)  Pre-existing out of facility DNR order (yellow form or pink MOST form)  -  "MOST" Form in Place?  -           Consults   none  DVT Prophylaxis  Lovenox   Lab Results  Component Value Date   PLT 327 10/23/2016     Time Spent in minutes   35 minutes Greater than 50% of time spent in care coordination and counseling patient regarding the condition and plan of care.   Auburn Bilberry M.D on 10/23/2016 at 2:15 PM  Between 7am to 6pm - Pager - [phone]  After 6pm go to [email or web address] - password [email or web address] [facility]  [facility]   Office  [phone]

## 2016-10-24 MED ORDER — HYDRALAZINE HCL 20 MG/ML IJ SOLN
10.0000 mg | INTRAMUSCULAR | Status: DC | PRN
  Administered 2016-10-24: 10 mg via INTRAVENOUS
  Filled 2016-10-24: qty 1

## 2016-10-24 MED ORDER — CEFUROXIME AXETIL 500 MG PO TABS: 500.0000 mg | ORAL_TABLET | Freq: Two times a day (BID) | ORAL | 0 refills | Status: AC

## 2016-10-24 MED ORDER — HYDROCODONE-ACETAMINOPHEN 5-325 MG PO TABS: 1.0000 | ORAL_TABLET | Freq: Four times a day (QID) | ORAL | 0 refills | Status: AC | PRN

## 2016-10-24 MED ORDER — ALPRAZOLAM 0.25 MG PO TABS: 0.2500 mg | ORAL_TABLET | Freq: Every evening | ORAL | 0 refills | Status: AC | PRN

## 2016-10-24 NOTE — Care Management (Signed)
Patient to return to [facility] today.  CSW facilitating. PT has assessed patient and recommends home health PT.  CSW has spoken with [facility], and they will arrange this post discharge.  RNCM signing off.

## 2016-10-24 NOTE — Progress Notes (Signed)
Pt restlessness and tearful. When asked if she was OK pt stated "Im in pain". Despite having dementia and word salad most of the shift. Pt was able to state she was having low back pain and rated at 10/10 on numeric scale. Pt. Medicated for pain per MAR.

## 2016-10-24 NOTE — Discharge Summary (Signed)
[facility] [place] [facility] [place], 83 y.o., DOB 1930/06/01, MRN [id number]. Admission date: 10/22/2016 Discharge Date 10/24/2016 Primary MD Barbette Reichmann, MD Admitting Physician Enid Baas, MD  Admission Diagnosis  Sepsis, due to unspecified organism [facility]) [A41.9]  Discharge Diagnosis   Active Problems:   Early sepsis felt to be due to UTI Urinary tract infection with cultures being negative due to recent antibiotics Acute encephalopathy due to UTI Hypothyroidism Dementia with intermittent agitation and behavioral changes Hypokalemia       Hospital CourseWilma [name]  is a 83 y.o. female with a known history of dementia, hypertension, hyperlipidemia, hypothyroidism and GERD who is from memory care unit at home place is brought in secondary to altered mental status. Patient was treated outpatient with oral antibiotics for UTI. And was brought to the hospital with fever tachycardia elevated WBC count. Her urine analysis was not that impressive. Her chest x-ray was negative blood cultures have remained negative. Urine culture showed no growth. She was treated with meropenem for 3 days. Her fevers have subsided. Her WBC count has normalized. She was be switched over to oral antibiotics. If she has no other episode of recurrent UTI needs urine culture and may need prolonged course of meropenem IV or IM. Patient back to baseline with her confusion and dementia.            Consults  None  Significant Tests:  See full reports for all details     Dg Chest Port 1 View  Result Date: 10/22/2016 CLINICAL DATA:  Flank pain.  Fever. EXAM: PORTABLE CHEST 1 VIEW COMPARISON:  09/12/2015 .  07/28/2015.  10/23/2013 . FINDINGS: Slight increased prominence in the superior mediastinum. No midline shift. Findings are most likely related prominent great vessels. PA lateral chest x-ray suggested for further evaluation. No focal pulmonary infiltrate.  Pleural-parenchymal thickening on the left noted consistent with scarring. Small left pleural effusion cannot be excluded. Old left rib fractures. IMPRESSION: 1. Slight increased prominence of the superior mediastinum. No midline shift. Findings are most likely related to prominent great vessels. PA lateral chest x-ray suggest for further evaluation. 2. Pleural-parenchymal thickening on the left noted most consistent scarring. Small left pleural effusion cannot be excluded. Electronically Signed   By: Maisie Fus  Register   On: 10/22/2016 12:11   Ct Renal Stone Study  Result Date: 10/22/2016 CLINICAL DATA:  Flank pain.  Suspect stone disease. EXAM: CT ABDOMEN AND PELVIS WITHOUT CONTRAST TECHNIQUE: Multidetector CT imaging of the abdomen and pelvis was performed following the standard protocol without IV contrast. COMPARISON:  09/09/2012 FINDINGS: Lower chest: The lung bases appear clear. No pleural or pericardial effusion identified. Hepatobiliary: No focal liver abnormality. No gallstones, gallbladder wall thickening or biliary dilatation. Pancreas: Unremarkable. No pancreatic ductal dilatation or surrounding inflammatory changes. Spleen: Normal in size without focal abnormality. Adrenals/Urinary Tract: Unremarkable appearance of the adrenal glands. Bilateral renal cortical scarring noted. Large parenchymal calcification within the upper pole of the left kidney is unchanged measuring 1.5 cm, image 34 of series 2. Indeterminate hyperdense lesion arising from the lower pole of left kidney measures 2.3 cm and 57 HU, image 46 of series 2. No renal stones or hydronephrosis identified. Stomach/Bowel: The stomach is normal. The small bowel loops have a normal course and caliber. No obstruction. Colonic diverticula Noted without acute inflammation. Vascular/Lymphatic: Aortic atherosclerosis. No aneurysm. No upper abdominal or pelvic adenopathy. No inguinal adenopathy. Reproductive: Status post hysterectomy. No adnexal  masses. Other: No abdominal wall  hernia or abnormality. No abdominopelvic ascites. Musculoskeletal: Degenerative disc disease is noted throughout the lumbar spine. Most advanced at L5-S1. IMPRESSION: 1. No acute findings within the abdomen or pelvis. No nephrolithiasis or hydronephrosis. 2. Hyperdense lesion arising from the inferior pole of the left kidney is new from previous exam. This may represent solid neoplasm versus hemorrhagic cyst. Further investigation with contrast enhanced MRI or CT is advised. 3. Similar appearance of large parenchymal calcification within the upper pole of left kidney. 4.  Aortic Atherosclerosis (ICD10-I70.0). Electronically Signed   By: Signa Kellaylor  Stroud M.D.   On: 10/22/2016 12:55       Today   Subjective:   Alicia [name]  patient confuse unable to provide any review of system  Objective:   Blood pressure (!) 152/88, pulse 78, temperature 98.7 F (37.1 C), temperature source Oral, resp. rate 20, height 5' (1.524 m), weight 160 lb (72.6 kg), SpO2 100 %.  .  Intake/Output Summary (Last 24 hours) at 10/24/16 0945 Last data filed at 10/24/16 0918  Gross per 24 hour  Intake           2571.9 ml  Output                0 ml  Net           2571.9 ml    Exam VITAL SIGNS: Blood pressure (!) 152/88, pulse 78, temperature 98.7 F (37.1 C), temperature source Oral, resp. rate 20, height 5' (1.524 m), weight 160 lb (72.6 kg), SpO2 100 %.  GENERAL:  83 y.o.-year-old patient lying in the bed with no acute distress.  EYES: Pupils equal, round, reactive to light and accommodation. No scleral icterus. Extraocular muscles intact.  HEENT: Head atraumatic, normocephalic. Oropharynx and nasopharynx clear.  NECK:  Supple, no jugular venous distention. No thyroid enlargement, no tenderness.  LUNGS: Normal breath sounds bilaterally, no wheezing, rales,rhonchi or crepitation. No use of accessory muscles of respiration.  CARDIOVASCULAR: S1, S2 normal. No murmurs, rubs, or gallops.   ABDOMEN: Soft, nontender, nondistended. Bowel sounds present. No organomegaly or mass.  EXTREMITIES: No pedal edema, cyanosis, or clubbing.  NEUROLOGIC: Confused  PSYCHIATRIC: Confused SKIN: No obvious rash, les ion, or ulcer.   Data Review     CBC w Diff: Lab Results  Component Value Date   WBC 10.9 10/23/2016   HGB 11.3 (L) 10/23/2016   HGB 11.4 (L) 05/04/2014   HCT 31.6 (L) 10/23/2016   HCT 36.2 05/04/2014   PLT 327 10/23/2016   PLT 273 05/04/2014   LYMPHOPCT 17 07/24/2015   LYMPHOPCT 16.1 05/04/2014   MONOPCT 8 07/24/2015   MONOPCT 8.5 05/04/2014   EOSPCT 5 07/24/2015   EOSPCT 3.2 05/04/2014   BASOPCT 0 07/24/2015   BASOPCT 0.6 05/04/2014   CMP: Lab Results  Component Value Date   NA 143 10/23/2016   NA 138 05/04/2014   K 3.1 (L) 10/23/2016   K 2.9 (L) 05/04/2014   CL 106 10/23/2016   CL 105 05/04/2014   CO2 28 10/23/2016   CO2 25 05/04/2014   BUN 18 10/23/2016   BUN 11 05/04/2014   CREATININE 0.84 10/23/2016   CREATININE 1.10 (H) 05/04/2014   PROT 7.6 10/22/2016   PROT 6.9 01/11/2013   ALBUMIN 3.2 (L) 10/22/2016   ALBUMIN 3.6 01/11/2013   BILITOT 0.7 10/22/2016   BILITOT 0.2 01/11/2013   ALKPHOS 114 10/22/2016   ALKPHOS 149 (H) 01/11/2013   AST 46 (H) 10/22/2016   AST 26 01/11/2013  ALT 44 10/22/2016   ALT 22 01/11/2013  .  Micro Results Recent Results (from the past 240 hour(s))  Blood Culture (routine x 2)     Status: None (Preliminary result)   Collection Time: 10/22/16 11:48 AM  Result Value Ref Range Status   Specimen Description BLOOD RIGHT ANTECUBITAL  Final   Special Requests   Final    BOTTLES DRAWN AEROBIC AND ANAEROBIC Blood Culture adequate volume   Culture NO GROWTH 2 DAYS  Final   Report Status PENDING  Incomplete  Blood Culture (routine x 2)     Status: None (Preliminary result)   Collection Time: 10/22/16 11:48 AM  Result Value Ref Range Status   Specimen Description BLOOD LEFT ANTECUBITAL  Final   Special Requests   Final     BOTTLES DRAWN AEROBIC AND ANAEROBIC Blood Culture results may not be optimal due to an excessive volume of blood received in culture bottles   Culture NO GROWTH 2 DAYS  Final   Report Status PENDING  Incomplete  Urine culture     Status: None   Collection Time: 10/22/16 11:48 AM  Result Value Ref Range Status   Specimen Description URINE, RANDOM  Final   Special Requests NONE  Final   Culture   Final    NO GROWTH Performed at [facility], [address], [place], [place] [address]    Report Status 10/23/2016 FINAL  Final  MRSA PCR Screening     Status: None   Collection Time: 10/22/16  6:42 PM  Result Value Ref Range Status   MRSA by PCR NEGATIVE NEGATIVE Final    Comment:        The GeneXpert MRSA Assay (FDA approved for NASAL specimens only), is one component of a comprehensive MRSA colonization surveillance program. It is not intended to diagnose MRSA infection nor to guide or monitor treatment for MRSA infections.         Code Status Orders        Start     Ordered   10/22/16 1622  Do not attempt resuscitation (DNR)  Continuous    Question Answer Comment  In the event of cardiac or respiratory ARREST Do not call a "code blue"   In the event of cardiac or respiratory ARREST Do not perform Intubation, CPR, defibrillation or ACLS   In the event of cardiac or respiratory ARREST Use medication by any route, position, wound care, and other measures to relive pain and suffering. May use oxygen, suction and manual treatment of airway obstruction as needed for comfort.      10/22/16 1621    Code Status History    Date Active Date Inactive Code Status Order ID Comments User Context   07/28/2015  5:47 PM 07/31/2015  5:56 PM DNR [id number]  Katha Hamming, MD ED   07/24/2015 11:27 PM 07/26/2015  6:26 PM DNR [id number]  Gery Pray, MD Inpatient    Advance Directive Documentation     Most Recent Value  Type of Advance Directive  Healthcare Power of Attorney,  Living will, Out of facility DNR (pink MOST or yellow form)  Pre-existing out of facility DNR order (yellow form or pink MOST form)  -  "MOST" Form in Place?  -            Discharge Medications   Allergies as of 10/24/2016   No Known Allergies     Medication List    TAKE these medications   acetaminophen 325 MG  tablet Commonly known as:  TYLENOL Take 2 tablets (650 mg total) by mouth every 6 (six) hours as needed for mild pain (or Fever >/= 101).   ALPRAZolam 0.25 MG tablet Commonly known as:  XANAX Take 1 tablet (0.25 mg total) by mouth at bedtime as needed for anxiety.   aspirin EC 81 MG tablet Take 81 mg by mouth daily.   bisacodyl 5 MG EC tablet Commonly known as:  DULCOLAX Take 5 mg by mouth daily as needed for moderate constipation.   cefUROXime 500 MG tablet Commonly known as:  CEFTIN Take 1 tablet (500 mg total) by mouth 2 (two) times daily.   cyanocobalamin 500 MCG tablet Take 500 mcg by mouth daily.   divalproex 125 MG capsule Commonly known as:  DEPAKOTE SPRINKLE Take 250 mg by mouth 2 (two) times daily.   docusate sodium 100 MG capsule Commonly known as:  COLACE Take 100 mg by mouth 2 (two) times daily as needed for mild constipation.   donepezil 10 MG tablet Commonly known as:  ARICEPT Take 10 mg by mouth at bedtime.   ferrous sulfate 325 (65 FE) MG EC tablet Take 325 mg by mouth daily with breakfast.   furosemide 20 MG tablet Commonly known as:  LASIX Take 20 mg by mouth daily.   HYDROcodone-acetaminophen 5-325 MG tablet Commonly known as:  NORCO/VICODIN Take 1 tablet by mouth every 6 (six) hours as needed for moderate pain.   levothyroxine 75 MCG tablet Commonly known as:  SYNTHROID, LEVOTHROID Take 75 mcg by mouth daily before breakfast.   memantine 10 MG tablet Commonly known as:  NAMENDA Take 10 mg by mouth 2 (two) times daily.   MYRBETRIQ 25 MG Tb24 tablet Generic drug:  mirabegron ER Take 25 mg by mouth daily.    ondansetron 4 MG tablet Commonly known as:  ZOFRAN Take 4 mg by mouth every 6 (six) hours as needed for nausea.   QUEtiapine 25 MG tablet Commonly known as:  SEROQUEL Take 25 mg by mouth at bedtime.   ranitidine 150 MG tablet Commonly known as:  ZANTAC Take 150 mg by mouth 2 (two) times daily.   sertraline 50 MG tablet Commonly known as:  ZOLOFT Take 150 mg by mouth daily.   simvastatin 20 MG tablet Commonly known as:  ZOCOR Take 20 mg by mouth at bedtime.            Discharge Care Instructions        Start     Ordered   10/24/16 0000  HYDROcodone-acetaminophen (NORCO/VICODIN) 5-325 MG tablet  Every 6 hours PRN     10/24/16 0945   10/24/16 0000  ALPRAZolam (XANAX) 0.25 MG tablet  At bedtime PRN     10/24/16 0945   10/24/16 0000  cefUROXime (CEFTIN) 500 MG tablet  2 times daily     10/24/16 0945         Total Time in preparing paper work, data evaluation and todays exam - 35 minutes  Auburn Bilberry M.D on 10/24/2016 at 9:45 AM  [facility]   Office  [phone]

## 2016-10-24 NOTE — Care Management Important Message (Signed)
Important Message  Patient Details  Name: Alicia LeachWilma B Perezperez MRN: [id number] Date of Birth: 08/24/1930   Medicare Important Message Given:  N/A - LOS <3 / Initial given by admissions    Chapman FitchBOWEN, Jennessa Trigo T, RN 10/24/2016, 10:07 AM

## 2016-10-24 NOTE — Progress Notes (Signed)
Physical Therapy Treatment Patient Details Name: Alicia [name] MRN: [id number] DOB: [birth year] Today's Date: 10/24/2016    History of Present Illness Pt admitted for complaints of AMS and flank pain. Pt now admitted for early sepsis and acute cystitis. History includes dementia, HTN, and GERD. Per chart review, pt from [facility] memory care unit.    PT Comments    Pt is up to side of bed with pt alternating being more willing to assist staying there and resisting the effort.  She was calmer once in bed and did respond positively that she wanted to work on walking again. Her plan is to return home today to [facility] if approved by MD, and will continue therapy with HHPT or staff at memory care if available.  Will continue acutely if the dc does not occur today.   Follow Up Recommendations   (memory care with HHPT if staff can assist pt)     Equipment Recommendations  None recommended by PT    Recommendations for Other Services       Precautions / Restrictions Precautions Precautions: Fall Restrictions Weight Bearing Restrictions: No    Mobility  Bed Mobility Overal bed mobility: Needs Assistance Bed Mobility: Supine to Sit     Supine to sit: Max assist;Total assist     General bed mobility comments: pt was resistant to getting up to side of bed and encouraged her to assist with sitting control  Transfers Overall transfer level: Needs assistance               General transfer comment: pt is very strong and likely can stand but would not stop extending her back against PT and could not therefore stand  Ambulation/Gait             General Gait Details: unable to safely attempt   Stairs            Wheelchair Mobility    Modified Rankin (Stroke Patients Only)       Balance   Sitting-balance support: Bilateral upper extremity supported;Feet unsupported (pt did not reliably have her feet on the ground) Sitting balance-Leahy Scale: Poor (cannot grade  as she could assist at times but then resisted)                                      Cognition Arousal/Alertness: Awake/alert Behavior During Therapy: Impulsive;Agitated Overall Cognitive Status: History of cognitive impairments - at baseline                                        Exercises      General Comments General comments (skin integrity, edema, etc.): son was available who is supportive and talked with pt about purpose of PT visit      Pertinent Vitals/Pain Pain Assessment: No/denies pain    Home Living                      Prior Function            PT Goals (current goals can now be found in the care plan section) Acute Rehab PT Goals Patient Stated Goal: none stated Progress towards PT goals: Progressing toward goals    Frequency    Min 2X/week      PT Plan Current plan remains appropriate  Co-evaluation              AM-PAC PT "6 Clicks" Daily Activity  Outcome Measure  Difficulty turning over in bed (including adjusting bedclothes, sheets and blankets)?: Unable Difficulty moving from lying on back to sitting on the side of the bed? : Unable Difficulty sitting down on and standing up from a chair with arms (e.g., wheelchair, bedside commode, etc,.)?: Unable Help needed moving to and from a bed to chair (including a wheelchair)?: Total Help needed walking in hospital room?: Total Help needed climbing 3-5 steps with a railing? : Total 6 Click Score: 6    End of Session   Activity Tolerance: Patient tolerated treatment well;Treatment limited secondary to agitation Patient left: in bed;with bed alarm set;with call bell/phone within reach;with family/visitor present Nurse Communication: Mobility status PT Visit Diagnosis: Muscle weakness (generalized) (M62.81)     Time: [id number] PT Time Calculation (min) (ACUTE ONLY): 26 min  Charges:  $Therapeutic Activity: 8-22 mins $Neuromuscular Re-education:  8-22 mins                    G Codes:  Functional Assessment Tool Used: AM-PAC 6 Clicks Basic Mobility     Ivar DrapeRuth E Nikitha Mode 10/24/2016, 11:39 AM   Samul Dadauth Octavius Shin, PT MS Acute Rehab Dept. Number: [facility] [phone] and [facility] [phone]

## 2016-10-24 NOTE — NC FL2 (Signed)
[place] MEDICAID FL2 LEVEL OF CARE SCREENING TOOL     IDENTIFICATION  Patient Name: Alicia LeachWilma B Borman Birthdate: 10/30/1930 Sex: female Admission Date (Current Location): 10/22/2016  [facility] [place]   Facility and Address:  [facility], [address], [place], [place] [address]      Provider Number: [id number]  Attending Physician Name and Address:  Auburn BilberryPatel, Shreyang, MD  Relative Name and Phone Number:       Current Level of Care: Hospital Recommended Level of Care: Memory Care Prior Approval Number:    Date Approved/Denied:   PASRR Number:    Discharge Plan:  (ALF Memory Care)    Current Diagnoses: Patient Active Problem List   Diagnosis Date Noted  . Acute cystitis 10/22/2016  . Lethargy 07/28/2015  . Chest pain 07/24/2015  . Dementia 07/24/2015  . Hypothyroidism 07/24/2015  . Fall 07/24/2015  . HTN (hypertension) 07/24/2015    Orientation RESPIRATION BLADDER Height & Weight     Self  Normal Incontinent Weight: 160 lb (72.6 kg) Height:  5' (152.4 cm)  BEHAVIORAL SYMPTOMS/MOOD NEUROLOGICAL BOWEL NUTRITION STATUS   (none)  (none) Continent Diet (dysphagia 1)  AMBULATORY STATUS COMMUNICATION OF NEEDS Skin    (wheelchair bound) Verbally Normal                       Personal Care Assistance Level of Assistance  Bathing, Dressing, Feeding Bathing Assistance: Limited assistance Feeding assistance: Limited assistance Dressing Assistance: Limited assistance Total Care Assistance: Limited assistance   Functional Limitations Info  Sight, Hearing Sight Info: Impaired Hearing Info: Impaired      SPECIAL CARE FACTORS FREQUENCY   home health: PT to evaluate and treat                    Contractures Contractures Info: Not present    Additional Factors Info  Code Status Code Status Info: dnr                Medication List             TAKE these medications            acetaminophen 325 MG  tablet Commonly known as:  TYLENOL Take 2 tablets (650 mg total) by mouth every 6 (six) hours as needed for mild pain (or Fever >/= 101).    ALPRAZolam 0.25 MG tablet Commonly known as:  XANAX Take 1 tablet (0.25 mg total) by mouth at bedtime as needed for anxiety.    aspirin EC 81 MG tablet Take 81 mg by mouth daily.    bisacodyl 5 MG EC tablet Commonly known as:  DULCOLAX Take 5 mg by mouth daily as needed for moderate constipation.    cefUROXime 500 MG tablet Commonly known as:  CEFTIN Take 1 tablet (500 mg total) by mouth 2 (two) times daily.    cyanocobalamin 500 MCG tablet Take 500 mcg by mouth daily.    divalproex 125 MG capsule Commonly known as:  DEPAKOTE SPRINKLE Take 250 mg by mouth 2 (two) times daily.    docusate sodium 100 MG capsule Commonly known as:  COLACE Take 100 mg by mouth 2 (two) times daily as needed for mild constipation.    donepezil 10 MG tablet Commonly known as:  ARICEPT Take 10 mg by mouth at bedtime.    ferrous sulfate 325 (65 FE) MG EC tablet Take 325 mg by mouth daily with breakfast.  furosemide 20 MG tablet Commonly known as:  LASIX Take 20 mg by mouth daily.    HYDROcodone-acetaminophen 5-325 MG tablet Commonly known as:  NORCO/VICODIN Take 1 tablet by mouth every 6 (six) hours as needed for moderate pain.    levothyroxine 75 MCG tablet Commonly known as:  SYNTHROID, LEVOTHROID Take 75 mcg by mouth daily before breakfast.    memantine 10 MG tablet Commonly known as:  NAMENDA Take 10 mg by mouth 2 (two) times daily.    MYRBETRIQ 25 MG Tb24 tablet Generic drug:  mirabegron ER Take 25 mg by mouth daily.    ondansetron 4 MG tablet Commonly known as:  ZOFRAN Take 4 mg by mouth every 6 (six) hours as needed for nausea.    QUEtiapine 25 MG tablet Commonly known as:  SEROQUEL Take 25 mg by mouth at bedtime.    ranitidine 150 MG tablet Commonly known as:  ZANTAC Take 150 mg by mouth 2 (two) times daily.     sertraline 50 MG tablet Commonly known as:  ZOLOFT Take 150 mg by mouth daily.    simvastatin 20 MG tablet Commonly known as:  ZOCOR Take 20 mg by mouth at bedtime.                             [place]

## 2016-10-27 LAB — CULTURE, BLOOD (ROUTINE X 2)
CULTURE: NO GROWTH
Culture: NO GROWTH
SPECIAL REQUESTS: ADEQUATE

## 2016-11-14 ENCOUNTER — Other Ambulatory Visit: Payer: Self-pay | Admitting: Family Medicine

## 2016-11-14 DIAGNOSIS — N289 Disorder of kidney and ureter, unspecified: Secondary | ICD-10-CM

## 2017-03-06 ENCOUNTER — Encounter: Payer: Self-pay | Admitting: General Surgery

## 2017-04-11 DEATH — deceased
